# Patient Record
Sex: Female | Born: 1970 | Hispanic: No | Marital: Married | Smoking: Never smoker
Health system: Southern US, Community
[De-identification: ages and names within clinical notes are randomized; demographics above are authoritative.]

---

## 2021-08-19 NOTE — Congregational Nurse Program (Signed)
  Dept: [phone]   Congregational Nurse Program Note  Date of Encounter: 08/19/2021  Past Medical History: No past medical history on file.  Encounter Details:  Newly arrived refugee from [place] as a refugee c/o Headache and neck pain. She lost her glasses a while back and has been experiencing headaches. She was concerned that headache may be related to high blood pressure but her reading was normal today. I will continue to monitor her on weekly basis.I have advised OTC analgesic like Tylenol and home remedies like warm compresses on her neck area.  Reading glasses size 2.5 problem. She is waiting for her medicaid and will establish PCP services thereafter.  Arman Bogus RN BSn PCCN  [facility] [phone]-cell [phone]-office

## 2021-08-20 ENCOUNTER — Other Ambulatory Visit: Payer: Self-pay

## 2021-09-18 NOTE — Congregational Nurse Program (Signed)
  Dept: [phone]   Congregational Nurse Program Note  Date of Encounter: 09/18/2021  Past Medical History: No past medical history on file.  Encounter Details:  CNP Questionnaire - 09/17/21 1400       Questionnaire   Do you give verbal consent to treat you today? Yes    Location Patient Served  [facility]    Visit Setting Church or Organization    Patient Status Refugee    Insurance Medicaid    Insurance Referral [facility]    Medication Have Medication Insecurities    Medical Provider Yes    Medical Referral Other    Medical Appointment Made Other    Food N/A    Transportation Need transportation assistance    Housing/Utilities N/A    ED Visit Averted Yes    Life-Saving Intervention Made N/A           Patient is requesting transportation assistance for an appointment with [facility] health Department. Patient will be assisted once she provided a phone number.   Nicole Cella Chester Sibert RN BSn PCCN  [facility] [phone]-cell [phone]-office

## 2021-10-08 DIAGNOSIS — R7309 Other abnormal glucose: Secondary | ICD-10-CM

## 2021-10-08 LAB — GLUCOSE, POCT (MANUAL RESULT ENTRY): POC Glucose: 98 mg/dl (ref 70–99)

## 2021-10-08 NOTE — Congregational Nurse Program (Signed)
  Dept: [phone]   Congregational Nurse Program Note  Date of Encounter: 10/08/2021  Past Medical History: No past medical history on file.  Encounter Details: Patient is requesting reading glasses, blood pressure check and blood sugar check. Reading glasses +2.00 provided per patient preference.   Nicole Cella Arantza Darrington RN BSn PCCN  [facility] [phone]-cell [phone]-office

## 2021-10-28 ENCOUNTER — Other Ambulatory Visit: Payer: Self-pay | Admitting: Obstetrics

## 2021-11-24 NOTE — Progress Notes (Signed)
Subjective:    Shannon Riley - 50 y.o. female MRN [id number]  Date of birth: 23-Oct-1971  HPI  Shannon Riley is to establish care.   Current issues and/or concerns: Reports spicy and fried foods cause heartburn and stomachache. Taking Omeprazole without relief. Was taking another medication that helped but cannot recall name. Denies coughing up blood or blood in the stool. Also, would like eye and dental exam.   ROS per HPI    Health Maintenance:  Health Maintenance Due  Topic Date Due   HIV Screening  Never done   Hepatitis C Screening  Never done   PAP SMEAR-Modifier  Never done   COLONOSCOPY (Pts 45-87yrs Insurance coverage will need to be confirmed)  Never done   MAMMOGRAM  Never done   Zoster Vaccines- Shingrix (1 of 2) Never done     Past Medical History: There are no problems to display for this patient.   Social History   reports that she has never smoked. She has never used smokeless tobacco. She reports that she does not drink alcohol and does not use drugs.   Family History  family history is not on file.   Medications: reviewed and updated   Objective:   Physical Exam BP 118/79 (BP Location: Left Arm, Patient Position: Sitting, Cuff Size: Normal)    Pulse 72    Temp 98 F (36.7 C)    Resp 18    Ht 5' 3.39" (1.61 m)    Wt 158 lb 3.2 oz (71.8 kg)    SpO2 98%    BMI 27.68 kg/m   Physical Exam HENT:     Head: Normocephalic and atraumatic.  Eyes:     Extraocular Movements: Extraocular movements intact.     Conjunctiva/sclera: Conjunctivae normal.     Pupils: Pupils are equal, round, and reactive to light.  Cardiovascular:     Rate and Rhythm: Normal rate and regular rhythm.     Pulses: Normal pulses.     Heart sounds: Normal heart sounds.  Pulmonary:     Effort: Pulmonary effort is normal.     Breath sounds: Normal breath sounds.  Musculoskeletal:     Cervical back: Normal range of motion and neck supple.  Neurological:      General: No focal deficit present.     Mental Status: She is alert and oriented to person, place, and time.  Psychiatric:        Mood and Affect: Mood normal.        Behavior: Behavior normal.       Assessment & Plan:  1. Encounter to establish care: - Patient presents today to establish care.  - Return for annual physical examination, labs, and health maintenance. Arrive fasting meaning having no food for at least 8 hours prior to appointment. You may have only water or black coffee. Please take scheduled medications as normal.  2. Gastroesophageal reflux disease, unspecified whether esophagitis present: - Begin Pantoprazole as prescribed.  - Referral to Gastroenterology for further evaluation and management.  - Ambulatory referral to Gastroenterology - pantoprazole (PROTONIX) 40 MG tablet; Take 1 tablet (40 mg total) by mouth daily.  Dispense: 30 tablet; Refill: 3  3. Routine eye exam: - Referral to Ophthalmology for further evaluation and management.  - Ambulatory referral to Ophthalmology  4. Encounter for routine dental examination: - Referral to Dentistry for further evaluation and management.  - Ambulatory referral to Dentistry  5. Language barrier: - [employer] participated during  today's appointment. Interpreter Name: Nelson Chimes, ID#: [id number].    Patient was given clear instructions to go to Emergency Department or return to medical center if symptoms don't improve, worsen, or new problems develop.The patient verbalized understanding.  I discussed the assessment and treatment plan with the patient. The patient was provided an opportunity to ask questions and all were answered. The patient agreed with the plan and demonstrated an understanding of the instructions.   The patient was advised to call back or seek an in-person evaluation if the symptoms worsen or if the condition fails to improve as anticipated.    Ricky Stabs, NP 12/02/2021, 2:31 PM Primary Care at  [facility]

## 2021-12-02 ENCOUNTER — Ambulatory Visit (INDEPENDENT_AMBULATORY_CARE_PROVIDER_SITE_OTHER): Payer: Medicaid Other | Admitting: Family

## 2021-12-02 ENCOUNTER — Encounter (INDEPENDENT_AMBULATORY_CARE_PROVIDER_SITE_OTHER): Payer: Self-pay

## 2021-12-02 ENCOUNTER — Encounter: Payer: Self-pay | Admitting: Family

## 2021-12-02 ENCOUNTER — Other Ambulatory Visit: Payer: Self-pay

## 2021-12-02 VITALS — BP 118/79 | HR 72 | Temp 98.0°F | Resp 18 | Ht 63.39 in | Wt 158.2 lb

## 2021-12-02 DIAGNOSIS — Z789 Other specified health status: Secondary | ICD-10-CM

## 2021-12-02 DIAGNOSIS — Z0100 Encounter for examination of eyes and vision without abnormal findings: Secondary | ICD-10-CM | POA: Diagnosis not present

## 2021-12-02 DIAGNOSIS — Z7689 Persons encountering health services in other specified circumstances: Secondary | ICD-10-CM | POA: Diagnosis not present

## 2021-12-02 DIAGNOSIS — Z0120 Encounter for dental examination and cleaning without abnormal findings: Secondary | ICD-10-CM | POA: Diagnosis not present

## 2021-12-02 DIAGNOSIS — K219 Gastro-esophageal reflux disease without esophagitis: Secondary | ICD-10-CM

## 2021-12-02 MED ORDER — PANTOPRAZOLE SODIUM 40 MG PO TBEC: 40.0000 mg | DELAYED_RELEASE_TABLET | Freq: Every day | ORAL | 3 refills | Status: DC

## 2021-12-02 NOTE — Progress Notes (Signed)
Pt presents to establish care been having gastric issues, Request referral to opthalmology and dental

## 2021-12-02 NOTE — Patient Instructions (Signed)
Thank you for choosing Primary Care at [facility] for your medical home!    Shannon Riley was seen by Rema Fendt, NP today.   Shannon Riley's primary care provider is Ricky Stabs, NP.   For the best care possible,  you should try to see Ricky Stabs, NP whenever you come to clinic.   We look forward to seeing you again soon!  If you have any questions about your visit today,  please call us at [phone]  Or feel free to reach your provider via MyChart.    Keeping you healthy   Get these tests Blood pressure- Have your blood pressure checked once a year by your healthcare provider.  Normal blood pressure is 120/80. Weight- Have your body mass index (BMI) calculated to screen for obesity.  BMI is a measure of body fat based on height and weight. You can also calculate your own BMI at https://www.west-esparza.com/. Cholesterol- Have your cholesterol checked regularly starting at age 43, sooner may be necessary if you have diabetes, high blood pressure, if a family member developed heart diseases at an early age or if you smoke.  Chlamydia, HIV, and other sexual transmitted disease- Get screened each year until the age of 55 then within three months of each new sexual partner. Diabetes- Have your blood sugar checked regularly if you have high blood pressure, high cholesterol, a family history of diabetes or if you are overweight.   Get these vaccines Flu shot- Every fall. Tetanus shot- Every 10 years. Menactra- Single dose; prevents meningitis.   Take these steps Don't smoke- If you do smoke, ask your healthcare provider about quitting. For tips on how to quit, go to www.smokefree.gov or call 1-800-QUIT-NOW. Be physically active- Exercise 5 days a week for at least 30 minutes.  If you are not already physically active start slow and gradually work up to 30 minutes of moderate physical activity.  Examples of moderate activity include walking briskly, mowing the  yard, dancing, swimming bicycling, etc. Eat a healthy diet- Eat a variety of healthy foods such as fruits, vegetables, low fat milk, low fat cheese, yogurt, lean meats, poultry, fish, beans, tofu, etc.  For more information on healthy eating, go to www.thenutritionsource.org Drink alcohol in moderation- Limit alcohol intake two drinks or less a day.  Never drink and drive. Dentist- Brush and floss teeth twice daily; visit your dentis twice a year. Depression-Your emotional health is as important as your physical health.  If you're feeling down, losing interest in things you normally enjoy please talk with your healthcare provider. Gun Safety- If you keep a gun in your home, keep it unloaded and with the safety lock on.  Bullets should be stored separately. Helmet use- Always wear a helmet when riding a motorcycle, bicycle, rollerblading or skateboarding. Safe sex- If you may be exposed to a sexually transmitted infection, use a condom Seat belts- Seat bels can save your life; always wear one. Smoke/Carbon Monoxide detectors- These detectors need to be installed on the appropriate level of your home.  Replace batteries at least once a year. Skin Cancer- When out in the sun, cover up and use sunscreen SPF 15 or higher. Violence- If anyone is threatening or hurting you, please tell your healthcare provider.

## 2021-12-10 ENCOUNTER — Other Ambulatory Visit: Payer: Self-pay | Admitting: Obstetrics and Gynecology

## 2021-12-10 ENCOUNTER — Ambulatory Visit
Admission: RE | Admit: 2021-12-10 | Discharge: 2021-12-10 | Disposition: A | Discharge status: Home / Self Care | Payer: No Typology Code available for payment source | Source: Ambulatory Visit | Attending: Obstetrics and Gynecology | Admitting: Obstetrics and Gynecology

## 2021-12-10 DIAGNOSIS — R7611 Nonspecific reaction to tuberculin skin test without active tuberculosis: Secondary | ICD-10-CM

## 2022-01-07 ENCOUNTER — Encounter: Payer: PRIVATE HEALTH INSURANCE | Admitting: Family

## 2022-01-31 NOTE — Progress Notes (Signed)
? ? ?Patient ID: Shannon Riley, female    DOB: 09-17-1971  MRN: [id number] ? ?CC: Annual Physical Exam  ? ?Subjective: ?Shannon Riley is a 51 y.o. female who presents for annual physical exam.  ? ?Her concerns today include:  ?VAGINAL CONCERN: ?Feels pressure in vaginal area. Denies pain with urination, blood in urine, vaginal itching and additional red flag symptoms. Declines PAP smear on today. ? ?2. RIGHT SHOULDER PAIN: ?3. RIGHT HIP PAIN: ?Having right shoulder and right hip pain for 10 to 12 days. Denies recent injury/trauma, chest pain, shortness of breath, edema, heavy lifting/pushing/pulling and additional red flag symptoms. Taking over-the-counter Tylenol providing some relief.  ? ? ?Current Outpatient Medications on File Prior to Visit  ?Medication Sig Dispense Refill  ? pantoprazole (PROTONIX) 40 MG tablet Take 1 tablet (40 mg total) by mouth daily. 30 tablet 3  ? ?No current facility-administered medications on file prior to visit.  ? ? ?No Known Allergies ? ?Social History  ? ?Socioeconomic History  ? Marital status: Married  ?  Spouse name: Not on file  ? Number of children: Not on file  ? Years of education: Not on file  ? Highest education level: Not on file  ?Occupational History  ? Not on file  ?Tobacco Use  ? Smoking status: Never  ?  Passive exposure: Never  ? Smokeless tobacco: Never  ?Vaping Use  ? Vaping Use: Never used  ?Substance and Sexual Activity  ? Alcohol use: Never  ? Drug use: Never  ? Sexual activity: Yes  ?Other Topics Concern  ? Not on file  ?Social History Narrative  ? Not on file  ? ?Social Determinants of Health  ? ?Financial Resource Strain: Not on file  ?Food Insecurity: Not on file  ?Transportation Needs: Not on file  ?Physical Activity: Not on file  ?Stress: Not on file  ?Social Connections: Not on file  ?Intimate Partner Violence: Not on file  ? ? ?History reviewed. No pertinent family history. ? ?History reviewed. No pertinent surgical history. ? ?ROS: ?Review of  Systems ?Negative except as stated above ? ?PHYSICAL EXAM: ?BP 96/66 (BP Location: Left Arm, Patient Position: Sitting, Cuff Size: Normal)   Pulse 70   Temp 98.3 ?F (36.8 ?C)   Resp 18   Ht 5' 3.39" (1.61 m)   Wt 165 lb (74.8 kg)   SpO2 98%   BMI 28.87 kg/m?  ? ?Physical Exam ?HENT:  ?   Head: Normocephalic and atraumatic.  ?   Right Ear: Tympanic membrane, ear canal and external ear normal.  ?   Left Ear: Tympanic membrane, ear canal and external ear normal.  ?Eyes:  ?   Extraocular Movements: Extraocular movements intact.  ?   Conjunctiva/sclera: Conjunctivae normal.  ?   Pupils: Pupils are equal, round, and reactive to light.  ?Cardiovascular:  ?   Rate and Rhythm: Normal rate and regular rhythm.  ?   Pulses: Normal pulses.  ?   Heart sounds: Normal heart sounds.  ?Pulmonary:  ?   Effort: Pulmonary effort is normal.  ?   Breath sounds: Normal breath sounds.  ?Chest:  ?   Comments: Patient declined exam. ?Abdominal:  ?   General: Bowel sounds are normal.  ?   Palpations: Abdomen is soft.  ?Genitourinary: ?   Comments: Patient declined exam.  ?Musculoskeletal:     ?   General: Normal range of motion.  ?   Cervical back: Normal range of motion and neck supple.  ?  Skin: ?   General: Skin is warm and dry.  ?   Capillary Refill: Capillary refill takes less than 2 seconds.  ?Neurological:  ?   General: No focal deficit present.  ?   Mental Status: She is alert and oriented to person, place, and time.  ?Psychiatric:     ?   Mood and Affect: Mood normal.     ?   Behavior: Behavior normal.  ? ?ASSESSMENT AND PLAN: ?1. Annual physical exam: ?- Counseled on 150 minutes of exercise per week as tolerated, healthy eating (including decreased daily intake of saturated fats, cholesterol, added sugars, sodium), STI prevention, and routine healthcare maintenance. ? ?2. Screening for metabolic disorder: ?- OIZ12+WPYK to check kidney function, liver function, and electrolyte balance.  ?- CMP14+EGFR ? ?3. Screening for  deficiency anemia: ?- CBC to screen for anemia. ?- CBC ? ?4. Diabetes mellitus screening: ?- Hemoglobin A1c to screen for pre-diabetes/diabetes. ?- Hemoglobin A1c ? ?5. Screening cholesterol level: ?- Lipid panel to screen for high cholesterol.  ?- Lipid panel ? ?6. Thyroid disorder screen: ?- TSH to check thyroid function.  ?- TSH ? ?7. Encounter for screening mammogram for malignant neoplasm of breast: ?- Referral for breast cancer screening by mammogram.  ?- MM Digital Screening; Future ? ?8. Pap smear for cervical cancer screening: ?9. Routine screening for STI (sexually transmitted infection): ?10. Vaginal discomfort: ?- Referral to Gynecology for further evaluation and management.  ?- Ambulatory referral to Gynecology ? ?11. Need for hepatitis C screening test: ?- Hepatitis C antibody to screen for hepatitis C.  ?- Hepatitis C Antibody ? ?12. Encounter for screening for HIV: ?- HIV antibody to screen for human immunodeficiency virus.  ?- HIV antibody (with reflex) ? ?13. Colon cancer screening: ?- Referral to Gastroenterology for colon cancer screening by colonoscopy. ?- Ambulatory referral to Gastroenterology ? ?14. Acute pain of right shoulder: ?15. Acute right hip pain: ?- Ibuprofen as prescribed.  ?- Continue over-the-counter Acetaminophen. ?- Follow-up with primary provider in 4 weeks or sooner if needed.  ?- ibuprofen (ADVIL) 600 MG tablet; Take 1 tablet (600 mg total) by mouth every 8 (eight) hours as needed.  Dispense: 30 tablet; Refill: 0 ? ?16. Language barrier: ?- [place] in-person interpreter,  Si Gaul, participated during today's appointment.  ? ? ? ?Patient was given the opportunity to ask questions.  Patient verbalized understanding of the plan and was able to repeat key elements of the plan. Patient was given clear instructions to go to Emergency Department or return to medical center if symptoms don't improve, worsen, or new problems develop.The patient verbalized understanding. ? ? ?Orders  Placed This Encounter  ?Procedures  ? MM Digital Screening  ? HIV antibody (with reflex)  ? Hepatitis C Antibody  ? CBC  ? Lipid panel  ? TSH  ? CMP14+EGFR  ? Hemoglobin A1c  ? Ambulatory referral to Gastroenterology  ? Ambulatory referral to Gynecology  ? ? ? ?Requested Prescriptions  ? ?Signed Prescriptions Disp Refills  ? ibuprofen (ADVIL) 600 MG tablet 30 tablet 0  ?  Sig: Take 1 tablet (600 mg total) by mouth every 8 (eight) hours as needed.  ? ? ?Return in about 1 year (around 02/05/2023) for Physical per patient preference, Follow-Up or next available 4 weeks shoulder pain . ? ?Camillia Herter, NP  ?

## 2022-02-04 ENCOUNTER — Ambulatory Visit (INDEPENDENT_AMBULATORY_CARE_PROVIDER_SITE_OTHER): Payer: Medicaid Other | Admitting: Family

## 2022-02-04 ENCOUNTER — Other Ambulatory Visit: Payer: Self-pay

## 2022-02-04 ENCOUNTER — Encounter: Payer: Self-pay | Admitting: Family

## 2022-02-04 VITALS — BP 96/66 | HR 70 | Temp 98.3°F | Resp 18 | Ht 63.39 in | Wt 165.0 lb

## 2022-02-04 DIAGNOSIS — Z13220 Encounter for screening for lipoid disorders: Secondary | ICD-10-CM

## 2022-02-04 DIAGNOSIS — Z1329 Encounter for screening for other suspected endocrine disorder: Secondary | ICD-10-CM

## 2022-02-04 DIAGNOSIS — M25551 Pain in right hip: Secondary | ICD-10-CM

## 2022-02-04 DIAGNOSIS — Z124 Encounter for screening for malignant neoplasm of cervix: Secondary | ICD-10-CM

## 2022-02-04 DIAGNOSIS — Z789 Other specified health status: Secondary | ICD-10-CM

## 2022-02-04 DIAGNOSIS — Z1231 Encounter for screening mammogram for malignant neoplasm of breast: Secondary | ICD-10-CM

## 2022-02-04 DIAGNOSIS — Z0001 Encounter for general adult medical examination with abnormal findings: Secondary | ICD-10-CM

## 2022-02-04 DIAGNOSIS — Z0000 Encounter for general adult medical examination without abnormal findings: Secondary | ICD-10-CM

## 2022-02-04 DIAGNOSIS — Z131 Encounter for screening for diabetes mellitus: Secondary | ICD-10-CM

## 2022-02-04 DIAGNOSIS — Z13228 Encounter for screening for other metabolic disorders: Secondary | ICD-10-CM

## 2022-02-04 DIAGNOSIS — Z130 Encounter for screening for diseases of the blood and blood-forming organs and certain disorders involving the immune mechanism: Secondary | ICD-10-CM

## 2022-02-04 DIAGNOSIS — M25511 Pain in right shoulder: Secondary | ICD-10-CM | POA: Diagnosis not present

## 2022-02-04 DIAGNOSIS — N949 Unspecified condition associated with female genital organs and menstrual cycle: Secondary | ICD-10-CM | POA: Diagnosis not present

## 2022-02-04 DIAGNOSIS — Z114 Encounter for screening for human immunodeficiency virus [HIV]: Secondary | ICD-10-CM

## 2022-02-04 DIAGNOSIS — Z1211 Encounter for screening for malignant neoplasm of colon: Secondary | ICD-10-CM

## 2022-02-04 DIAGNOSIS — Z113 Encounter for screening for infections with a predominantly sexual mode of transmission: Secondary | ICD-10-CM

## 2022-02-04 DIAGNOSIS — Z1159 Encounter for screening for other viral diseases: Secondary | ICD-10-CM

## 2022-02-04 DIAGNOSIS — Z758 Other problems related to medical facilities and other health care: Secondary | ICD-10-CM

## 2022-02-04 MED ORDER — IBUPROFEN 600 MG PO TABS: 600.0000 mg | ORAL_TABLET | Freq: Three times a day (TID) | ORAL | 0 refills | Status: DC | PRN

## 2022-02-04 NOTE — Progress Notes (Signed)
Pt presents for annual physical exam declined pap, having complaints of right arm and leg pain  ?

## 2022-02-04 NOTE — Patient Instructions (Signed)

## 2022-02-05 ENCOUNTER — Encounter: Payer: Self-pay | Admitting: Family

## 2022-02-05 DIAGNOSIS — R7303 Prediabetes: Secondary | ICD-10-CM | POA: Insufficient documentation

## 2022-02-05 LAB — CMP14+EGFR
ALT: 19 IU/L (ref 0–32)
AST: 20 IU/L (ref 0–40)
Albumin/Globulin Ratio: 1.6 (ref 1.2–2.2)
Albumin: 4.5 g/dL (ref 3.8–4.9)
Alkaline Phosphatase: 82 IU/L (ref 44–121)
BUN/Creatinine Ratio: 17 (ref 9–23)
BUN: 11 mg/dL (ref 6–24)
Bilirubin Total: <0.2 mg/dL (ref 0.0–1.2)
CO2: 24 mmol/L (ref 20–29)
Calcium: 9.1 mg/dL (ref 8.7–10.2)
Chloride: 104 mmol/L (ref 96–106)
Creatinine, Ser: 0.66 mg/dL (ref 0.57–1.00)
Globulin, Total: 2.8 g/dL (ref 1.5–4.5)
Glucose: 95 mg/dL (ref 70–99)
Potassium: 4.4 mmol/L (ref 3.5–5.2)
Sodium: 141 mmol/L (ref 134–144)
Total Protein: 7.3 g/dL (ref 6.0–8.5)
eGFR: 106 mL/min/{1.73_m2} (ref >59)

## 2022-02-05 LAB — HEMOGLOBIN A1C
Est. average glucose Bld gHb Est-mCnc: 120 mg/dL
Hgb A1c MFr Bld: 5.8 % — ABNORMAL HIGH (ref 4.8–5.6)

## 2022-02-05 LAB — CBC
Hematocrit: 39.4 % (ref 34.0–46.6)
Hemoglobin: 12.8 g/dL (ref 11.1–15.9)
MCH: 26.9 pg (ref 26.6–33.0)
MCHC: 32.5 g/dL (ref 31.5–35.7)
MCV: 83 fL (ref 79–97)
Platelets: 337 10*3/uL (ref 150–450)
RBC: 4.76 x10E6/uL (ref 3.77–5.28)
RDW: 13.1 % (ref 11.7–15.4)
WBC: 5.8 10*3/uL (ref 3.4–10.8)

## 2022-02-05 LAB — HEPATITIS C ANTIBODY: Hep C Virus Ab: NONREACTIVE

## 2022-02-05 LAB — HIV ANTIBODY (ROUTINE TESTING W REFLEX): HIV Screen 4th Generation wRfx: NONREACTIVE

## 2022-02-05 LAB — LIPID PANEL
Chol/HDL Ratio: 3.7 ratio (ref 0.0–4.4)
Cholesterol, Total: 181 mg/dL (ref 100–199)
HDL: 49 mg/dL (ref >39)
LDL Chol Calc (NIH): 105 mg/dL — ABNORMAL HIGH (ref 0–99)
Triglycerides: 157 mg/dL — ABNORMAL HIGH (ref 0–149)
VLDL Cholesterol Cal: 27 mg/dL (ref 5–40)

## 2022-02-05 LAB — TSH: TSH: 1.79 u[IU]/mL (ref 0.450–4.500)

## 2022-02-05 NOTE — Progress Notes (Signed)
Call patient with update.  ? ?The following abnormalities are noted:   ?-  Hemoglobin A1c is consistent with pre-diabetes. Practice healthy eating habits of fresh fruit and vegetables, lean baked meats such as chicken, fish, and [place]; limit breads, rice, pastas, and desserts; practice regular aerobic exercise (at least 150 minutes a week as tolerated). No medication needed as of present. ?-  Cholesterol higher than expected. High cholesterol may increase risk of heart attack and/or stroke. Consider eating more fruits, vegetables, and lean baked meats such as chicken or fish. Moderate intensity exercise at least 150 minutes as tolerated per week may help as well. However, your risk of heart attack/stroke in ten years is low so does not need to start a medication as of present. ? ?All other values are normal, stable or within acceptable limits. ? ?Medication changes / Follow up labs / Other changes or recommendations:   ?- Recheck prediabetes in 6  months.  ?- Recheck cholesterol routinely.  ? ?The following is for provider reference only: ?The 10-year ASCVD risk score (Arnett DK, et al., 2019) is: 0.7% ?  Values used to calculate the score: ?    Age: 50 years ?    Sex: Female ?    Is Non-Hispanic African American: Yes ?    Diabetic: No ?    Tobacco smoker: No ?    Systolic Blood Pressure: 96 mmHg ?    Is BP treated: No ?    HDL Cholesterol: 49 mg/dL ?    Total Cholesterol: 181 mg/dL ? ?Rema Fendt, NP 02/05/2022 7:31 AM  ?

## 2022-02-18 ENCOUNTER — Other Ambulatory Visit: Payer: Self-pay

## 2022-02-18 ENCOUNTER — Ambulatory Visit (HOSPITAL_COMMUNITY)
Admission: EM | Admit: 2022-02-18 | Discharge: 2022-02-18 | Disposition: A | Discharge status: Home / Self Care | Payer: Medicaid Other | Attending: Family Medicine | Admitting: Family Medicine

## 2022-02-18 ENCOUNTER — Encounter (HOSPITAL_COMMUNITY): Payer: Self-pay | Admitting: Emergency Medicine

## 2022-02-18 DIAGNOSIS — M25511 Pain in right shoulder: Secondary | ICD-10-CM

## 2022-02-18 LAB — GLUCOSE, POCT (MANUAL RESULT ENTRY): POC Glucose: 124 mg/dl — AB (ref 70–99)

## 2022-02-18 MED ORDER — MELOXICAM 15 MG PO TABS: 15.0000 mg | ORAL_TABLET | Freq: Every day | ORAL | 0 refills | Status: DC

## 2022-02-18 NOTE — Congregational Nurse Program (Signed)
?  Dept: [phone] ? ? ?Congregational Nurse Program Note ? ?Date of Encounter: 02/18/2022 ? ?Past Medical History: ?No past medical history on file. ? ?Encounter Details: ? CNP Questionnaire - 02/18/22 1116   ? ?  ? Questionnaire  ? Do you give verbal consent to treat you today? Yes   ? Location Patient Served  [facility]   ? Visit Setting Church or Organization   ? Patient Status Refugee   ? Insurance Medicaid   ? Insurance Referral [facility]   ? Medication Have Medication Insecurities;Provided Medication Assistance   ? Medical Provider Yes   ? Screening Referrals N/A   ? Medical Referral Urgent Care   ? Medical Appointment Made N/A   ? Food N/A   ? Transportation Need transportation assistance   ? Housing/Utilities N/A   ? Interpersonal Safety N/A   ? Intervention Blood pressure;Navigate Healthcare System;Counsel;Educate;Support;Advocate;Blood glucose;Case Management   ? ED Visit Averted Yes   ? Life-Saving Intervention Made N/A   ? ?  ?  ? ?  ? ?Patient speaks [employer], on site interpreter used for this visit.  ?C/o right shoulder and upper arm pain.  Blood sugar 124 non fasting. Patient denies falling or injury to shoulder. ?Referred to urgent care. ? ?Arman Bogus RN BSn PCCN  ?[facility] ?[phone]-cell ?[phone]-office ? ? ? ? ?

## 2022-02-18 NOTE — ED Provider Notes (Signed)
? ?[facility] ? ? ? ?CSN: [id number] ?Arrival date & time: 02/18/22  1207 ? ? ?  ? ?History   ?Chief Complaint ?Chief Complaint  ?Patient presents with  ? Shoulder Pain  ? ? ?HPI ?Shannon Riley is a 51 y.o. female.  ? ?Right Shoulder Pain ?Started 1 month ago ?Insidious onset, no injury ?States that it is mostly in the lateral aspect of her shoulder, but has been radiating up into her neck and down into her lower arm ?Denies any numbness and tingling ?States that she was taking her ESL class today and was sent to the nurse because she was having difficulty holding her pencil due to the pain ?Was sent here for further evaluation ?She has been taking Tylenol for this and not improving ?She does not have a history of shoulder pain or neck problems ?No fevers or chills ?Otherwise feeling well ? ?Dari iPad interpretor Porfirio MylarCarmen used for entirety of encounter ? ? ? ?History reviewed. No pertinent past medical history. ? ?Patient Active Problem List  ? Diagnosis Date Noted  ? Prediabetes 02/05/2022  ? ? ?History reviewed. No pertinent surgical history. ? ?OB History   ?No obstetric history on file. ?  ? ? ? ?Home Medications   ? ?Prior to Admission medications   ?Medication Sig Start Date End Date Taking? Authorizing Provider  ?meloxicam (MOBIC) 15 MG tablet Take 1 tablet (15 mg total) by mouth daily. 02/18/22  Yes Sheray Grist, Solmon IceBailey J, DO  ?ibuprofen (ADVIL) 600 MG tablet Take 1 tablet (600 mg total) by mouth every 8 (eight) hours as needed. 02/04/22   Rema FendtStephens, Amy J, NP  ?pantoprazole (PROTONIX) 40 MG tablet Take 1 tablet (40 mg total) by mouth daily. 12/02/21   Rema FendtStephens, Amy J, NP  ? ? ?Family History ?No family history on file. ? ?Social History ?Social History  ? ?Tobacco Use  ? Smoking status: Never  ?  Passive exposure: Never  ? Smokeless tobacco: Never  ?Vaping Use  ? Vaping Use: Never used  ?Substance Use Topics  ? Alcohol use: Never  ? Drug use: Never  ? ? ? ?Allergies   ?Patient has no known  allergies. ? ? ?Review of Systems ?Review of Systems  ?All other systems reviewed and are negative. ? ?Per HPI ?Physical Exam ?Triage Vital Signs ?ED Triage Vitals  ?Enc Vitals Group  ?   BP   ?   Pulse   ?   Resp   ?   Temp   ?   Temp src   ?   SpO2   ?   Weight   ?   Height   ?   Head Circumference   ?   Peak Flow   ?   Pain Score   ?   Pain Loc   ?   Pain Edu?   ?   Excl. in GC?   ? ?No data found. ? ?Updated Vital Signs ?BP 118/82 (BP Location: Left Arm)   Pulse 77   Temp 98 ?F (36.7 ?C) (Oral)   Resp 17   SpO2 98%  ? ?Visual Acuity ?Right Eye Distance:   ?Left Eye Distance:   ?Bilateral Distance:   ? ?Right Eye Near:   ?Left Eye Near:    ?Bilateral Near:    ? ?Physical Exam ?Constitutional:   ?   General: She is not in acute distress. ?   Appearance: She is well-developed. She is not ill-appearing or toxic-appearing.  ?HENT:  ?  Head: Normocephalic and atraumatic.  ?   Right Ear: Tympanic membrane, ear canal and external ear normal.  ?   Left Ear: Tympanic membrane, ear canal and external ear normal.  ?   Nose: Nose normal. No congestion or rhinorrhea.  ?   Mouth/Throat:  ?   Mouth: Mucous membranes are moist.  ?   Pharynx: No oropharyngeal exudate or posterior oropharyngeal erythema.  ?Eyes:  ?   Conjunctiva/sclera: Conjunctivae normal.  ?Cardiovascular:  ?   Rate and Rhythm: Normal rate.  ?Pulmonary:  ?   Effort: Pulmonary effort is normal. No respiratory distress.  ?   Breath sounds: Normal breath sounds. No wheezing, rhonchi or rales.  ?Musculoskeletal:  ?   Cervical back: Neck supple. No rigidity or tenderness.  ?   Comments: Right Shoulder: ?Inspection reveals no obvious deformity, atrophy, or asymmetry b/l. No bruising. No swelling ?Palpation is normal with no TTP over [facility] joint or bicipital groove b/l.  She does have some tenderness and hypertonicity within the right upper trapezius ?Full ROM in flexion, abduction b/l with pain at extremes of motion ?On right, IR to back pocket, ER decreased by 20  degrees ?On left full IR and ER ?NV intact distally b/l ?Special Tests:  ?- Impingement: Positive Hawkins ?- Supraspinatous: Equivocal empty can ?- Infraspinatous/Teres Minor: 5/5 strength with ER with pain ?- Subscapularis: 5/5 strength with IR with pain ?- Biceps tendon: Positive Speeds ?- Labrum: Positive Obriens ?- No drop arm sign ?   ?Lymphadenopathy:  ?   Cervical: No cervical adenopathy.  ?Skin: ?   General: Skin is warm and dry.  ?   Capillary Refill: Capillary refill takes less than 2 seconds.  ?Neurological:  ?   Mental Status: She is alert and oriented to person, place, and time.  ? ? ? ?UC Treatments / Results  ?Labs ?(all labs ordered are listed, but only abnormal results are displayed) ?Labs Reviewed - No data to display ? ?EKG ? ? ?Radiology ?No results found. ? ?Procedures ?Procedures (including critical care time) ? ?Medications Ordered in UC ?Medications - No data to display ? ?Initial Impression / Assessment and Plan / UC Course  ?I have reviewed the triage vital signs and the nursing notes. ? ?Pertinent labs & imaging results that were available during my care of the patient were reviewed by me and considered in my medical decision making (see chart for details). ? ?  ? ?Exam and history most consistent with right shoulder rotator cuff tendinitis.  She has no weakness on exam which is reassuring, although pan-positive pain.  Will trial meloxicam daily for 1-2 weeks, then daily as needed for her pain.  Advised to avoid other NSAIDs while taking, but can take tylenol prn.  Recommend pendulum swings as well.  Can f/u with sports medicine in 1-2 weeks if not improving. ? ? ?Final Clinical Impressions(s) / UC Diagnoses  ? ?Final diagnoses:  ?Acute pain of right shoulder  ? ? ? ?Discharge Instructions   ? ?  ?As we discussed, I think that you are aggravated your rotator cuff.  You can take the medication that I prescribed for you once daily for the next 1 to 2 weeks.  This medicine will help with pain  and inflammation.  Do not take it with ibuprofen.  You can take Tylenol while you are taking this.  Make sure that you continue to move your shoulder, but do not do any heavy overhead lifting.  You can follow-up with the  sports medicine office in 1 to 2 weeks if you are not improving. ? ? ? ? ?ED Prescriptions   ? ? Medication Sig Dispense Auth. Provider  ? meloxicam (MOBIC) 15 MG tablet Take 1 tablet (15 mg total) by mouth daily. 15 tablet Macio Kissoon, Solmon Ice, DO  ? ?  ? ?PDMP not reviewed this encounter. ?  ?Unknown Jim, DO ?02/18/22 1334 ? ?

## 2022-02-18 NOTE — Discharge Instructions (Signed)
As we discussed, I think that you are aggravated your rotator cuff.  You can take the medication that I prescribed for you once daily for the next 1 to 2 weeks.  This medicine will help with pain and inflammation.  Do not take it with ibuprofen.  You can take Tylenol while you are taking this.  Make sure that you continue to move your shoulder, but do not do any heavy overhead lifting.  You can follow-up with the sports medicine office in 1 to 2 weeks if you are not improving. ?

## 2022-02-18 NOTE — ED Triage Notes (Signed)
Pt c/o right shoulder pain for a month and referred here by community nurse. Denies any falls or injuries. Pt reports that pain also in neck and right arm. Pt reports the pain so bad can't lift her right arm.  ?

## 2022-02-23 NOTE — Congregational Nurse Program (Signed)
Patient came to check blood pressure. Pressure 128/90 ?

## 2022-03-01 NOTE — Progress Notes (Signed)
? ? ?Patient ID: Shannon Riley, female    DOB: [birth year]  MRN: [id number] ? ?CC: Urgent Care Follow-Up ? ?Subjective: ?Shannon Riley is a 51 y.o. female who presents for urgent care follow-up.  ? ?Her concerns today include:  ?URGENT CARE FOLLOW-UP: ?02/18/2022 at [facility] per DO note: ?Exam and history most consistent with right shoulder rotator cuff tendinitis.  She has no weakness on exam which is reassuring, although pan-positive pain.  Will trial meloxicam daily for 1-2 weeks, then daily as needed for her pain.  Advised to avoid other NSAIDs while taking, but can take tylenol prn.  Recommend pendulum swings as well.  Can f/u with sports medicine in 1-2 weeks if not improving. ? ?03/05/2022: ?Continued right shoulder pain with limited range of motion. Meloxicam helping some.  ? ?2. WOMEN'S HEALTH CONCERN: ?Reports no period for 6 months. Also, feels like pressure in uterus especially right lower side. Requesting female only provider at Gynecology. ? ? ?Patient Active Problem List  ? Diagnosis Date Noted  ? Prediabetes 02/05/2022  ?  ? ?Current Outpatient Medications on File Prior to Visit  ?Medication Sig Dispense Refill  ? ibuprofen (ADVIL) 600 MG tablet Take 1 tablet (600 mg total) by mouth every 8 (eight) hours as needed. 30 tablet 0  ? pantoprazole (PROTONIX) 40 MG tablet Take 1 tablet (40 mg total) by mouth daily. 30 tablet 3  ? ?No current facility-administered medications on file prior to visit.  ? ? ?No Known Allergies ? ?Social History  ? ?Socioeconomic History  ? Marital status: Married  ?  Spouse name: Not on file  ? Number of children: Not on file  ? Years of education: Not on file  ? Highest education level: Not on file  ?Occupational History  ? Not on file  ?Tobacco Use  ? Smoking status: Never  ?  Passive exposure: Never  ? Smokeless tobacco: Never  ?Vaping Use  ? Vaping Use: Never used  ?Substance and Sexual Activity  ? Alcohol use: Never  ? Drug use: Never  ?  Sexual activity: Yes  ?Other Topics Concern  ? Not on file  ?Social History Narrative  ? Not on file  ? ?Social Determinants of Health  ? ?Financial Resource Strain: Not on file  ?Food Insecurity: Not on file  ?Transportation Needs: Not on file  ?Physical Activity: Not on file  ?Stress: Not on file  ?Social Connections: Not on file  ?Intimate Partner Violence: Not on file  ? ? ?No family history on file. ? ?No past surgical history on file. ? ?ROS: ?Review of Systems ?Negative except as stated above ? ?PHYSICAL EXAM: ?BP 120/82 (BP Location: Left Arm, Patient Position: Sitting, Cuff Size: Large)   Pulse 61   Temp 98 ?F (36.7 ?C)   Resp 18   Ht 5' 3.39" (1.61 m)   Wt 160 lb (72.6 kg)   SpO2 98%   BMI 28.00 kg/m?  ? ?Physical Exam ?HENT:  ?   Head: Normocephalic and atraumatic.  ?Eyes:  ?   Extraocular Movements: Extraocular movements intact.  ?   Conjunctiva/sclera: Conjunctivae normal.  ?   Pupils: Pupils are equal, round, and reactive to light.  ?Cardiovascular:  ?   Rate and Rhythm: Normal rate and regular rhythm.  ?   Pulses: Normal pulses.  ?   Heart sounds: Normal heart sounds.  ?Pulmonary:  ?   Effort: Pulmonary effort is normal.  ?   Breath sounds: Normal  breath sounds.  ?Musculoskeletal:  ?   Cervical back: Normal range of motion and neck supple.  ?   Comments: Limited range of motion right upper extremity.   ?Neurological:  ?   General: No focal deficit present.  ?   Mental Status: She is alert and oriented to person, place, and time.  ?Psychiatric:     ?   Mood and Affect: Mood normal.     ?   Behavior: Behavior normal.  ? ? ?ASSESSMENT AND PLAN: ?1. Acute pain of right shoulder: ?- Continue Meloxicam as prescribed.  ?- Diagnostic x-ray right shoulder for further evaluation.  ?- Referral to Sports Medicine for further evaluation and management.  ?- Ambulatory referral to Sports Medicine ?- DG Shoulder Right; Future ?- meloxicam (MOBIC) 15 MG tablet; Take 1 tablet (15 mg total) by mouth daily.   Dispense: 30 tablet; Refill: 0 ? ?2. No periods: ?3. Uterus problem: ?- Discussed possibly nearing menopause.  ?- Referral to Gynecology for further evaluation and management. ?- Ambulatory referral to Gynecology ? ?4. Language barrier: ?- [place] in-person interpreter, Mihan, participated during today's appointment.  ? ? ? ?Patient was given the opportunity to ask questions.  Patient verbalized understanding of the plan and was able to repeat key elements of the plan. Patient was given clear instructions to go to Emergency Department or return to medical center if symptoms don't improve, worsen, or new problems develop.The patient verbalized understanding. ? ? ?Orders Placed This Encounter  ?Procedures  ? DG Shoulder Right  ? Ambulatory referral to Sports Medicine  ? Ambulatory referral to Gynecology  ? ? ? ?Requested Prescriptions  ? ?Signed Prescriptions Disp Refills  ? meloxicam (MOBIC) 15 MG tablet 30 tablet 0  ?  Sig: Take 1 tablet (15 mg total) by mouth daily.  ? ? ?Follow-up with primary provider as scheduled.  ? ?Rema Fendt, NP  ?

## 2022-03-05 ENCOUNTER — Encounter (INDEPENDENT_AMBULATORY_CARE_PROVIDER_SITE_OTHER): Payer: Self-pay

## 2022-03-05 ENCOUNTER — Ambulatory Visit (INDEPENDENT_AMBULATORY_CARE_PROVIDER_SITE_OTHER): Payer: Medicaid Other

## 2022-03-05 ENCOUNTER — Ambulatory Visit: Payer: Medicaid Other | Admitting: Family

## 2022-03-05 ENCOUNTER — Encounter: Payer: Self-pay | Admitting: Family

## 2022-03-05 VITALS — BP 120/82 | HR 61 | Temp 98.0°F | Resp 18 | Ht 63.39 in | Wt 160.0 lb

## 2022-03-05 DIAGNOSIS — N912 Amenorrhea, unspecified: Secondary | ICD-10-CM

## 2022-03-05 DIAGNOSIS — Z789 Other specified health status: Secondary | ICD-10-CM | POA: Diagnosis not present

## 2022-03-05 DIAGNOSIS — N949 Unspecified condition associated with female genital organs and menstrual cycle: Secondary | ICD-10-CM

## 2022-03-05 DIAGNOSIS — M25511 Pain in right shoulder: Secondary | ICD-10-CM

## 2022-03-05 DIAGNOSIS — Z603 Acculturation difficulty: Secondary | ICD-10-CM

## 2022-03-05 MED ORDER — MELOXICAM 15 MG PO TABS: 15.0000 mg | ORAL_TABLET | Freq: Every day | ORAL | 0 refills | Status: DC

## 2022-03-05 NOTE — Progress Notes (Signed)
Pt presents for right shoulder pain started about a month ago  ?

## 2022-03-06 NOTE — Progress Notes (Signed)
Call patient with update.  ? ?Right shoulder spurring. Continue with plan discussed in office.

## 2022-04-06 ENCOUNTER — Ambulatory Visit
Admission: RE | Admit: 2022-04-06 | Discharge: 2022-04-06 | Disposition: A | Discharge status: Home / Self Care | Payer: Medicaid Other | Source: Ambulatory Visit | Attending: Family | Admitting: Family

## 2022-04-06 ENCOUNTER — Encounter: Payer: Self-pay | Admitting: Advanced Practice Midwife

## 2022-04-06 ENCOUNTER — Other Ambulatory Visit (HOSPITAL_COMMUNITY)
Admission: RE | Admit: 2022-04-06 | Discharge: 2022-04-06 | Disposition: A | Discharge status: Home / Self Care | Payer: Medicaid Other | Source: Ambulatory Visit | Attending: Advanced Practice Midwife | Admitting: Advanced Practice Midwife

## 2022-04-06 ENCOUNTER — Ambulatory Visit (INDEPENDENT_AMBULATORY_CARE_PROVIDER_SITE_OTHER): Payer: Medicaid Other | Admitting: Advanced Practice Midwife

## 2022-04-06 VITALS — BP 131/83 | HR 69 | Ht 64.0 in | Wt 157.6 lb

## 2022-04-06 DIAGNOSIS — R102 Pelvic and perineal pain unspecified side: Secondary | ICD-10-CM

## 2022-04-06 DIAGNOSIS — N926 Irregular menstruation, unspecified: Secondary | ICD-10-CM

## 2022-04-06 DIAGNOSIS — Z3202 Encounter for pregnancy test, result negative: Secondary | ICD-10-CM

## 2022-04-06 DIAGNOSIS — Z01419 Encounter for gynecological examination (general) (routine) without abnormal findings: Secondary | ICD-10-CM

## 2022-04-06 DIAGNOSIS — R1030 Lower abdominal pain, unspecified: Secondary | ICD-10-CM

## 2022-04-06 DIAGNOSIS — N914 Secondary oligomenorrhea: Secondary | ICD-10-CM

## 2022-04-06 DIAGNOSIS — N951 Menopausal and female climacteric states: Secondary | ICD-10-CM

## 2022-04-06 DIAGNOSIS — Z124 Encounter for screening for malignant neoplasm of cervix: Secondary | ICD-10-CM | POA: Diagnosis not present

## 2022-04-06 DIAGNOSIS — Z1231 Encounter for screening mammogram for malignant neoplasm of breast: Secondary | ICD-10-CM

## 2022-04-06 DIAGNOSIS — Z1211 Encounter for screening for malignant neoplasm of colon: Secondary | ICD-10-CM

## 2022-04-06 LAB — POCT URINALYSIS DIPSTICK
Bilirubin, UA: NEGATIVE
Glucose, UA: NEGATIVE
Ketones, UA: NEGATIVE
Nitrite, UA: NEGATIVE
Protein, UA: NEGATIVE
Spec Grav, UA: 1.01 (ref 1.010–1.025)
Urobilinogen, UA: 0.2 E.U./dL
pH, UA: 7 (ref 5.0–8.0)

## 2022-04-06 LAB — POCT URINE PREGNANCY: Preg Test, Ur: NEGATIVE

## 2022-04-06 NOTE — Progress Notes (Signed)
? ?Subjective:  ?  ? Shannon Riley is a 51 y.o. female here at [facility] for a routine exam.  Current complaints: cramping lower abdominal pain x 1 year that is intermittent.  Last menstrual period was 7 months ago, stopped when she was in refugee [place] leaving [place] so she initially thought it was stress related but periods have not resumed.  She denies any vasomotor symptoms of menopause.  Personal health questionnaire reviewed: yes. ? ?Do you have a primary care provider? yes ?Do you feel safe at home? Yes. Pt is refugee from [place] so months of living in camps in several countries before arriving in the [place].   ? ?[place] Office Visit from 04/06/2022 in [place]  ?PHQ-2 Total Score 0  ? ?  ? ? ?Health Maintenance Due  ?Topic Date Due  ? PAP SMEAR-Modifier  Never done  ? COLONOSCOPY (Pts 45-9yr Insurance coverage will need to be confirmed)  Never done  ? MAMMOGRAM  Never done  ?  ? ?Risk factors for chronic health problems: ?Smoking: ?Alchohol/how much: ?Pt BMI: Body mass index is 27.05 kg/m?. ?  ?Gynecologic History ?No LMP recorded. Patient is perimenopausal. ?Contraception: none ?Last Pap: unknown. Results were: normal ?Last mammogram: today, 04/06/22. Results were: unknown, report not yet available ? ?Obstetric History ?OB History  ?Gravida Para Term Preterm AB Living  ?1         1  ?SAB IAB Ectopic Multiple Live Births  ?        1  ?  ?# Outcome Date GA Lbr Len/2nd Weight Sex Delivery Anes PTL Lv  ?1 Gravida           ? ? ? ?The following portions of the patient's history were reviewed and updated as appropriate: allergies, current medications, past family history, past medical history, past social history, past surgical history, and problem list. ? ?Review of Systems ?Pertinent items noted in HPI and remainder of comprehensive ROS otherwise negative.  ?  ?Objective:  ? ?BP 131/83   Pulse 69   Ht 5' 4"  (1.626 m)   Wt 157 lb 9.6 oz (71.5 kg)   BMI  27.05 kg/m?  ?VS reviewed, nursing note reviewed,  ?Constitutional: well developed, well nourished, no distress ?HEENT: normocephalic ?CV: normal rate ?Pulm/chest wall: normal effort ?Breast Exam:  exam performed: right breast normal without mass, skin or nipple changes or axillary nodes, left breast normal without mass, skin or nipple changes or axillary nodes ?Abdomen: soft ?Neuro: alert and oriented x 3 ?Skin: warm, dry ?Psych: affect normal ?Pelvic exam: Performed: Cervix pink, visually closed, without lesion, scant white creamy discharge, vaginal walls and external genitalia normal ?Bimanual exam: Cervix 0/long/high, firm, anterior, neg CMT, uterus tender, slightly enlarged, adnexa without tenderness, enlargement, or mass  ? ? ?   ?Assessment/Plan:  ? ?1. Late menses ?--Likely perimenopause, but given pt stressful year as refugee, and associated abdominal pain, will do labs to confirm status and rule out other conditions. ? ?- Prolactin ?- TSH ?- CBC ?- Comp Met (CMET) ?- Beta hCG quant (ref lab) ?- POCT urine pregnancy ? ?2. Perimenopause ? ? ?3. Screening for cervical cancer ? ?- Cytology - PAP( [place]) ? ?4. Screen for colon cancer ?--Pt saw PCP, but did not discuss colon cancer screening.  Discussed cologuard vs colonoscopy.  Pt prefers to do colonoscopy with family hx colon cancer. ?- Ambulatory referral to Gastroenterology ? ?5. Secondary oligomenorrhea ? ?- [place]  PELVIC COMPLETE WITH TRANSVAGINAL; Future ? ?6. Pelvic pain in female ?--Pain intermittent, uterus slightly enlarged, ? fibroids ?- US PELVIC COMPLETE WITH TRANSVAGINAL; Future ?- POCT Urinalysis Dipstick ?- Urine Culture ? ?7. Lower abdominal pain ?--Large hemoglobin in urine, small leukocytes ?--[place], follow up in office for results ? ?- Urine Culture  ? ? ? ?Return for Schedule pelvic US and follow up gyn visit for ultrasound results.  ? ?Fatima Blank, CNM ?4:58 PM   ?

## 2022-04-06 NOTE — Progress Notes (Signed)
NGYN pt presents for annual c/o intermittent low abdominal pain x 1 yr.  ?STD screening declined  ?PHQ9=0 ?GAD7=0 ?Mammogram completed today  ?Colonoscopy - NEVER ? ?

## 2022-04-07 LAB — CBC
Hematocrit: 38.4 % (ref 34.0–46.6)
Hemoglobin: 13.1 g/dL (ref 11.1–15.9)
MCH: 27.5 pg (ref 26.6–33.0)
MCHC: 34.1 g/dL (ref 31.5–35.7)
MCV: 81 fL (ref 79–97)
Platelets: 284 10*3/uL (ref 150–450)
RBC: 4.76 x10E6/uL (ref 3.77–5.28)
RDW: 13 % (ref 11.7–15.4)
WBC: 5.6 10*3/uL (ref 3.4–10.8)

## 2022-04-07 LAB — COMPREHENSIVE METABOLIC PANEL
ALT: 14 IU/L (ref 0–32)
AST: 22 IU/L (ref 0–40)
Albumin/Globulin Ratio: 1.9 (ref 1.2–2.2)
Albumin: 4.7 g/dL (ref 3.8–4.9)
Alkaline Phosphatase: 82 IU/L (ref 44–121)
BUN/Creatinine Ratio: 14 (ref 9–23)
BUN: 9 mg/dL (ref 6–24)
Bilirubin Total: 0.4 mg/dL (ref 0.0–1.2)
CO2: 25 mmol/L (ref 20–29)
Calcium: 9.4 mg/dL (ref 8.7–10.2)
Chloride: 103 mmol/L (ref 96–106)
Creatinine, Ser: 0.66 mg/dL (ref 0.57–1.00)
Globulin, Total: 2.5 g/dL (ref 1.5–4.5)
Glucose: 97 mg/dL (ref 70–99)
Potassium: 4.1 mmol/L (ref 3.5–5.2)
Sodium: 139 mmol/L (ref 134–144)
Total Protein: 7.2 g/dL (ref 6.0–8.5)
eGFR: 106 mL/min/{1.73_m2} (ref >59)

## 2022-04-07 LAB — TSH: TSH: 1.51 u[IU]/mL (ref 0.450–4.500)

## 2022-04-07 LAB — BETA HCG QUANT (REF LAB): hCG Quant: 2 m[IU]/mL

## 2022-04-07 LAB — PROLACTIN: Prolactin: 8.5 ng/mL (ref 4.8–23.3)

## 2022-04-07 NOTE — Progress Notes (Signed)
Call patient with update.  ? ?Mammogram no evidence of malignancy. Repeat in 12 months.

## 2022-04-08 LAB — CYTOLOGY - PAP
Comment: NEGATIVE
Diagnosis: NEGATIVE
High risk HPV: NEGATIVE

## 2022-04-08 LAB — URINE CULTURE

## 2022-04-09 ENCOUNTER — Ambulatory Visit: Payer: Medicaid Other

## 2022-04-13 ENCOUNTER — Ambulatory Visit
Admission: RE | Admit: 2022-04-13 | Discharge: 2022-04-13 | Disposition: A | Discharge status: Home / Self Care | Payer: Medicaid Other | Source: Ambulatory Visit | Attending: Advanced Practice Midwife | Admitting: Advanced Practice Midwife

## 2022-04-13 DIAGNOSIS — R102 Pelvic and perineal pain: Secondary | ICD-10-CM | POA: Insufficient documentation

## 2022-04-13 DIAGNOSIS — N914 Secondary oligomenorrhea: Secondary | ICD-10-CM | POA: Diagnosis present

## 2022-04-15 ENCOUNTER — Ambulatory Visit: Payer: Medicaid Other

## 2022-04-27 ENCOUNTER — Ambulatory Visit: Payer: Self-pay

## 2022-04-27 ENCOUNTER — Ambulatory Visit (INDEPENDENT_AMBULATORY_CARE_PROVIDER_SITE_OTHER): Payer: Medicaid Other | Admitting: Family Medicine

## 2022-04-27 ENCOUNTER — Encounter: Payer: Self-pay | Admitting: Family Medicine

## 2022-04-27 ENCOUNTER — Encounter: Payer: Self-pay | Admitting: Obstetrics and Gynecology

## 2022-04-27 ENCOUNTER — Ambulatory Visit (INDEPENDENT_AMBULATORY_CARE_PROVIDER_SITE_OTHER): Payer: Medicaid Other | Admitting: Obstetrics and Gynecology

## 2022-04-27 VITALS — BP 128/82 | Ht 64.0 in | Wt 160.9 lb

## 2022-04-27 VITALS — BP 125/80 | HR 71 | Ht 63.0 in | Wt 162.3 lb

## 2022-04-27 DIAGNOSIS — G8929 Other chronic pain: Secondary | ICD-10-CM

## 2022-04-27 DIAGNOSIS — M25561 Pain in right knee: Secondary | ICD-10-CM

## 2022-04-27 DIAGNOSIS — Z712 Person consulting for explanation of examination or test findings: Secondary | ICD-10-CM | POA: Diagnosis not present

## 2022-04-27 DIAGNOSIS — M25511 Pain in right shoulder: Secondary | ICD-10-CM

## 2022-04-27 MED ORDER — METHYLPREDNISOLONE ACETATE 40 MG/ML IJ SUSP
40.0000 mg | Freq: Once | INTRAMUSCULAR | Status: AC
  Administered 2022-04-27: 40 mg via INTRA_ARTICULAR

## 2022-04-27 NOTE — Patient Instructions (Signed)
You have a frozen shoulder (adhesive capsulitis), a buildup of scar tissue that limits motion of the shoulder joint. Limit lifting and overhead activities as much as possible. Heat 15 minutes at a time 3-4 times a day may help with movement and stiffness. Tylenol and/or meloxicam as needed for pain and inflammation. Steroid injections in a series have been shown to help with pain and motion - you were given one of these today. Codman exercises (pendulum, wall walking or table slides, arm circles) - do 3 sets of 10 once or twice a day. Physical therapy for rotator cuff strengthening is a consideration once you are out of the painful phase Follow up in 1 month.  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 Do home exercises daily as directed. Add ankle weight if these become too easy. Consider formal physical therapy. Avoid flat shoes, barefoot walking as much as possible. Icing 15 minutes at a time 3-4 times a day as needed.

## 2022-04-27 NOTE — Progress Notes (Signed)
Patient was instructed in 10 minutes of therapeutic exercises for right shoulder/right knee pain to improve strength, ROM and function according to my instructions and plan of care by a Certified Athletic Trainer during the office visit.  Proper technique shown and discussed, handout provided.  All questions discussed and answered.

## 2022-04-27 NOTE — Progress Notes (Signed)
51 yo here to discuss results of recent ultrasound. Patient reports feeling well since her last visit. She remains amenorrheic, now for 8 months. She denies any pelvic pain or abnormal discharge. She denies experiencing the lower abdominal pain she expressed at her last visit. She is without any complaints  No past medical history on file. No past surgical history on file. Family History  Problem Relation Age of Onset   Cancer Maternal Aunt    Breast cancer Maternal Aunt    Social History   Tobacco Use   Smoking status: Never    Passive exposure: Never   Smokeless tobacco: Never  Vaping Use   Vaping Use: Never used  Substance Use Topics   Alcohol use: Never   Drug use: Never   ROS See pertinent in HPI. All other systems reviewed and non contributory Blood pressure 125/80, pulse 71, height 5\' 3"  (1.6 m), weight 162 lb 4.8 oz (73.6 kg).  GENERAL: Well-developed, well-nourished female in no acute distress.  NEURO: alert and oriented x 3  DG Shoulder Right  Result Date: 03/06/2022 CLINICAL DATA:  Right shoulder pain. EXAM: RIGHT SHOULDER - 2+ VIEW COMPARISON:  None. FINDINGS: Glenohumeral and acromioclavicular joint spaces are appropriately aligned. Minimal inferior glenoid and humeral head-neck junction degenerative spurring. No significant osteoarthritis of the acromioclavicular joint. No acute fracture or dislocation. The visualized portion of the right lung is unremarkable. IMPRESSION: Minimal inferior glenohumeral degenerative spurring. Electronically Signed   By: 03/08/2022 M.D.   On: 03/06/2022 10:55   MM 3D SCREEN BREAST BILATERAL  Result Date: 04/07/2022 CLINICAL DATA:  Screening. EXAM: DIGITAL SCREENING BILATERAL MAMMOGRAM WITH TOMOSYNTHESIS AND CAD TECHNIQUE: Bilateral screening digital craniocaudal and mediolateral oblique mammograms were obtained. Bilateral screening digital breast tomosynthesis was performed. The images were evaluated with computer-aided detection. Best  images possible per technologist communication. COMPARISON:  None. ACR Breast Density Category b: There are scattered areas of fibroglandular density. FINDINGS: There are no findings suspicious for malignancy. IMPRESSION: No mammographic evidence of malignancy. A result letter of this screening mammogram will be mailed directly to the patient. RECOMMENDATION: Screening mammogram in one year. (Code:SM-B-01Y) BI-RADS CATEGORY  1: Negative. Electronically Signed   By: 06/07/2022 M.D.   On: 04/07/2022 09:06   06/07/2022 PELVIC COMPLETE WITH TRANSVAGINAL  Result Date: 04/13/2022 CLINICAL DATA:  Pelvic pain in a female, secondary oligomenorrhea, N91.4, R10.2, unknown LMP, question perienopausal EXAM: TRANSABDOMINAL AND TRANSVAGINAL ULTRASOUND OF PELVIS TECHNIQUE: Both transabdominal and transvaginal ultrasound examinations of the pelvis were performed. Transabdominal technique was performed for global imaging of the pelvis including uterus, ovaries, adnexal regions, and pelvic cul-de-sac. It was necessary to proceed with endovaginal exam following the transabdominal exam to visualize the uterus and LEFT ovary. COMPARISON:  None FINDINGS: Uterus Measurements: 6.0 x 3.6 x 4.2 cm = volume: 48 mL. Retroverted. Mildly heterogeneous myometrium. Subserosal leiomyoma at anterior fundus 19 x 13 x 14 mm. No additional mass. Endometrium Thickness: 2 mm.  No endometrial fluid or mass Right ovary Measurements: 3.4 x 2.1 x 3.1 cm = volume: 11.6 mL. Simple cyst 3.1 cm diameter; no follow-up imaging recommended. Left ovary Measurements: 2.4 x 1.5 x 1.0 cm = volume: 1.8 mL. Normal morphology without mass Other findings No free pelvic fluid.  No adnexal masses. IMPRESSION: Subserosal leiomyoma anterior uterine fundus 19 mm greatest diameter. Remainder of exam normal. Electronically Signed   By: 06/13/2022 M.D.   On: 04/13/2022 14:31     A/P 51 yo here to discuss test  results - Reviewed ultrasound report - reviewed labs -  Reassurance provided as she is in perimenopause - No interventions needed for small fibroid uterus - No GYN explanation for intermittent lower abdominal pain - Follow up colonoscopy as scheduled

## 2022-04-27 NOTE — Progress Notes (Signed)
PCP: Camillia Herter, NP  Subjective:   HPI: Patient is a 51 y.o. female here for right shoulder and right knee pain.  R Shoulder Pain Started 5 months ago. Pain began gradually but then worsened and has been same severity for several months now. There was no inciting injury or trauma.  The pain is only present with movement, particularly reaching back or if she moves fast in any direction. Most of the pain is in lateral shoulder area and radiates into upper arm. She was seen in ED 2 months ago and given Meloxicam for presumed rotator cuff tendinitis. Found it helpful but symptoms recurred if she didn't take the Meloxicam. She is right hand dominant but does not do any repetitive or overhead motions. She has been doing pendulum exercises twice daily without much improvement. Denies numbness, tingling, or overt weakness.  R Knee Pain Present for 7-8 months. No known injury or trauma. Located in anterior knee, mostly at inferior aspect. Occasionally has pain posteriorly as well. The pain occurs when walking, particularly with stairs, and when standing from sitting position. Has tried gentle massage without improvement. No swelling, catching, locking.  No past medical history on file.  Current Outpatient Medications on File Prior to Visit  Medication Sig Dispense Refill   ibuprofen (ADVIL) 600 MG tablet Take 1 tablet (600 mg total) by mouth every 8 (eight) hours as needed. (Patient not taking: Reported on 04/06/2022) 30 tablet 0   meloxicam (MOBIC) 15 MG tablet Take 1 tablet (15 mg total) by mouth daily. 30 tablet 0   pantoprazole (PROTONIX) 40 MG tablet Take 1 tablet (40 mg total) by mouth daily. 30 tablet 3   No current facility-administered medications on file prior to visit.    No past surgical history on file.  No Known Allergies  BP 128/82   Ht 5\' 4"  (1.626 m)   Wt 160 lb 15 oz (73 kg)   BMI 27.62 kg/m      Objective:  Physical Exam:  Gen: NAD, comfortable in exam room  R  Shoulder: Inspection shows no deformity, swelling, atrophy or skin changes Mild diffuse TTP Decreased ROM in external rotation and abduction. FROM with internal rotation NV intact distally Special Tests: . - Supraspinatus: 5/5 strength with resisted flexion at 20 degrees - Infraspinatous/Teres Minor: 5/5 strength with ER - Subscapularis: 5/5 strength with IR - Biceps tendon: Negative Speeds - AC Joint: Negative cross arm  R Knee: Inspection normal without deformity, swelling or skin changes. No effusion. No tenderness to palpation. FROM. 5/5 strength with flexion and extension. Normal anterior and posterior drawer. No varus or valgus laxity.    Assessment & Plan:  1. Right Shoulder Pain--- patient with several month history of shoulder pain, now with limited ROM noted on exam even passively. Presentation concerning for frozen shoulder, from what likely began as impingement. Intra-articular steroid injection performed today. Recommended Codman exercises. Follow up in 1 month.  After informed written consent timeout was performed, patient was seated in chair in exam room. Right shoulder was prepped with alcohol swab and utilizing posterior approach with ultrasound guidance, patient's right glenohumeral space was injected with 3:1 lidocaine: depomedrol. Patient tolerated the procedure well without immediate complications.  2. Patellofemoral Syndrome-- patient provides classic history for patellofemoral syndrome of right knee. Given home exercises for VMO and glut medius strengthening.  Alcus Dad, MD PGY-2 [facility]

## 2022-06-01 ENCOUNTER — Ambulatory Visit: Payer: Medicaid Other | Admitting: Family Medicine

## 2022-11-24 ENCOUNTER — Ambulatory Visit: Payer: Self-pay

## 2022-11-24 NOTE — Telephone Encounter (Signed)
  Chief Complaint: Resolved lump under left arm Symptoms: none Frequency:  Pertinent Negatives: Patient denies  Disposition: [] ED /[] Urgent Care (no appt availability in office) / [] Appointment(In office/virtual)/ []  [facility]/ [] Home Care/ [] Refused Recommended Disposition /[] [place] [facility]/ [x]  Follow-up with PCP Additional Notes: Spoke with pr using pacific interpreters. Pt states she is not having any stomach issues. Pt states she only called to get an appointment to have someone look at a lump under her left arm which has resolved. Pt said it drained and is now fine.    Summary: Stomach issues   Caller states pt is "having trouble w/ her stomach"  Caller is not w/ pt and not listed on DPR  Caller states pt can be reached at [phone]  Pt requires interpreter       Answer Assessment - Initial Assessment Questions 1. APPEARANCE of BOIL: "What does the boil look like?"      Resolved 2. LOCATION: "Where is the boil located?"      Under left arm 3. NUMBER: "How many boils are there?"      Was 1 - now none 4. SIZE: "How big is the boil?" (e.g., inches, cm; compare to size of a coin or other object)      5. ONSET: "When did the boil start?"      6. PAIN: "Is there any pain?" If Yes, ask: "How bad is the pain?"   (Scale 1-10; or mild, moderate, severe)      7. FEVER: "Do you have a fever?" If Yes, ask: "What is it, how was it measured, and when did it start?"       8. SOURCE: "Have you been around anyone with boils or other Staph infections?" "Have you ever had boils before?"      9. OTHER SYMPTOMS: "Do you have any other symptoms?" (e.g., shaking chills, weakness, rash elsewhere on body)      10. PREGNANCY: "Is there any chance you are pregnant?" "When was your last menstrual period?"  Protocols used: Boil (Skin Abscess)-A-AH

## 2022-11-25 NOTE — Progress Notes (Deleted)
Patient ID: Shannon Riley, female    DOB: [birth year]  MRN: [id number]  CC: No chief complaint on file.   Subjective: Shannon Riley is a 51 y.o. female who presents for  Her concerns today include:  11/24/2022 per triage RN note: Chief Complaint: Resolved lump under left arm Symptoms: none Frequency:  Pertinent Negatives: Patient denies  Disposition: [] ED /[] Urgent Care (no appt availability in office) / [] Appointment(In office/virtual)/ []  [facility]/ [] Home Care/ [] Refused Recommended Disposition /[] [facility]/ [x]  Follow-up with PCP Additional Notes: Spoke with pr using pacific interpreters. Pt states she is not having any stomach issues. Pt states she only called to get an appointment to have someone look at a lump under her left arm which has resolved. Pt said it drained and is now fine.           Summary: Stomach issues     Caller states pt is "having trouble w/ her stomach"  Caller is not w/ pt and not listed on DPR  Caller states pt can be reached at [phone]  Pt requires interpreter        Answer Assessment - Initial Assessment Questions 1. APPEARANCE of BOIL: "What does the boil look like?"      Resolved 2. LOCATION: "Where is the boil located?"      Under left arm 3. NUMBER: "How many boils are there?"      Was 1 - now none 4. SIZE: "How big is the boil?" (e.g., inches, cm; compare to size of a coin or other object)      5. ONSET: "When did the boil start?"      6. PAIN: "Is there any pain?" If Yes, ask: "How bad is the pain?"   (Scale 1-10; or mild, moderate, severe)      7. FEVER: "Do you have a fever?" If Yes, ask: "What is it, how was it measured, and when did it start?"       8. SOURCE: "Have you been around anyone with boils or other Staph infections?" "Have you ever had boils before?"      9. OTHER SYMPTOMS: "Do you have any other symptoms?" (e.g., shaking chills, weakness, rash elsewhere on body)      10.  PREGNANCY: "Is there any chance you are pregnant?" "When was your last menstrual period?"  Today's visit 12/01/2022:  Patient Active Problem List   Diagnosis Date Noted   Prediabetes 02/05/2022     Current Outpatient Medications on File Prior to Visit  Medication Sig Dispense Refill   ibuprofen (ADVIL) 600 MG tablet Take 1 tablet (600 mg total) by mouth every 8 (eight) hours as needed. (Patient not taking: Reported on 04/06/2022) 30 tablet 0   meloxicam (MOBIC) 15 MG tablet Take 1 tablet (15 mg total) by mouth daily. 30 tablet 0   pantoprazole (PROTONIX) 40 MG tablet Take 1 tablet (40 mg total) by mouth daily. (Patient not taking: Reported on 04/27/2022) 30 tablet 3   No current facility-administered medications on file prior to visit.    No Known Allergies  Social History   Socioeconomic History   Marital status: Married    Spouse name: Not on file   Number of children: Not on file   Years of education: Not on file   Highest education level: Not on file  Occupational History   Not on file  Tobacco Use   Smoking status: Never    Passive exposure: Never  Smokeless tobacco: Never  Vaping Use   Vaping Use: Never used  Substance and Sexual Activity   Alcohol use: Never   Drug use: Never   Sexual activity: Not Currently    Birth control/protection: None  Other Topics Concern   Not on file  Social History Narrative   Not on file   Social Determinants of Health   Financial Resource Strain: Not on file  Food Insecurity: Not on file  Transportation Needs: Not on file  Physical Activity: Not on file  Stress: Not on file  Social Connections: Not on file  Intimate Partner Violence: Not on file    Family History  Problem Relation Age of Onset   Cancer Maternal Aunt    Breast cancer Maternal Aunt     No past surgical history on file.  ROS: Review of Systems Negative except as stated above  PHYSICAL EXAM: There were no vitals taken for this visit.  Physical  Exam  {female adult master:310786} {female adult master:310785}     Latest Ref Rng & Units 04/06/2022    3:14 PM 02/04/2022    2:07 PM  CMP  Glucose 70 - 99 mg/dL 97  95   BUN 6 - 24 mg/dL 9  11   Creatinine 7.61 - 1.00 mg/dL 4.70  9.29   Sodium 574 - 144 mmol/L 139  141   Potassium 3.5 - 5.2 mmol/L 4.1  4.4   Chloride 96 - 106 mmol/L 103  104   CO2 20 - 29 mmol/L 25  24   Calcium 8.7 - 10.2 mg/dL 9.4  9.1   Total Protein 6.0 - 8.5 g/dL 7.2  7.3   Total Bilirubin 0.0 - 1.2 mg/dL 0.4  <7.3   Alkaline Phos 44 - 121 IU/L 82  82   AST 0 - 40 IU/L 22  20   ALT 0 - 32 IU/L 14  19    Lipid Panel     Component Value Date/Time   CHOL 181 02/04/2022 1407   TRIG 157 (H) 02/04/2022 1407   HDL 49 02/04/2022 1407   CHOLHDL 3.7 02/04/2022 1407   LDLCALC 105 (H) 02/04/2022 1407    CBC    Component Value Date/Time   WBC 5.6 04/06/2022 1514   RBC 4.76 04/06/2022 1514   HGB 13.1 04/06/2022 1514   HCT 38.4 04/06/2022 1514   PLT 284 04/06/2022 1514   MCV 81 04/06/2022 1514   MCH 27.5 04/06/2022 1514   MCHC 34.1 04/06/2022 1514   RDW 13.0 04/06/2022 1514    ASSESSMENT AND PLAN:  There are no diagnoses linked to this encounter.   Patient was given the opportunity to ask questions.  Patient verbalized understanding of the plan and was able to repeat key elements of the plan. Patient was given clear instructions to go to Emergency Department or return to medical center if symptoms don't improve, worsen, or new problems develop.The patient verbalized understanding.   No orders of the defined types were placed in this encounter.    Requested Prescriptions    No prescriptions requested or ordered in this encounter    No follow-ups on file.  Rema Fendt, NP

## 2022-12-01 ENCOUNTER — Ambulatory Visit: Payer: Medicaid Other | Admitting: Family

## 2023-01-18 NOTE — Congregational Nurse Program (Signed)
Client came in to check BP - WNL.

## 2023-01-22 ENCOUNTER — Ambulatory Visit: Payer: Medicaid Other | Admitting: Family

## 2023-01-27 NOTE — Progress Notes (Signed)
Patient ID: Shannon Riley, female    DOB: [birth year]  MRN: [id number]  CC: Follow-Up  Subjective: Shannon Riley is a 52 y.o. female who presents for follow-up.   Her concerns today include:  Intermittent bilateral hand and feet numbness for 2 to 3 months. Worse during the night. She denies red flag symptoms. 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 No further issues/concerns for discussion today.   Patient Active Problem List   Diagnosis Date Noted   Prediabetes 02/05/2022     Current Outpatient Medications on File Prior to Visit  Medication Sig Dispense Refill   ibuprofen (ADVIL) 600 MG tablet Take 1 tablet (600 mg total) by mouth every 8 (eight) hours as needed. (Patient not taking: Reported on 04/06/2022) 30 tablet 0   meloxicam (MOBIC) 15 MG tablet Take 1 tablet (15 mg total) by mouth daily. 30 tablet 0   pantoprazole (PROTONIX) 40 MG tablet Take 1 tablet (40 mg total) by mouth daily. (Patient not taking: Reported on 04/27/2022) 30 tablet 3   No current facility-administered medications on file prior to visit.    No Known Allergies  Social History   Socioeconomic History   Marital status: Married    Spouse name: Not on file   Number of children: Not on file   Years of education: Not on file   Highest education level: Not on file  Occupational History   Not on file  Tobacco Use   Smoking status: Never    Passive exposure: Never   Smokeless tobacco: Never  Vaping Use   Vaping Use: Never used  Substance and Sexual Activity   Alcohol use: Never   Drug use: Never   Sexual activity: Not Currently    Birth control/protection: None  Other Topics Concern   Not on file  Social History Narrative   Not on file   Social Determinants of Health   Financial Resource Strain: Not on file  Food Insecurity: Not on file  Transportation Needs: Not on file  Physical Activity: Not on file  Stress: Not on file  Social Connections: Not  on file  Intimate Partner Violence: Not on file    Family History  Problem Relation Age of Onset   Cancer Maternal Aunt    Breast cancer Maternal Aunt     No past surgical history on file.  ROS: Review of Systems Negative except as stated above  PHYSICAL EXAM: BP 111/79 (BP Location: Left Arm, Patient Position: Sitting, Cuff Size: Large)   Pulse 61   Temp 98.3 F (36.8 C)   Resp 16   Ht 5' 2.99" (1.6 m)   Wt 153 lb (69.4 kg)   SpO2 97%   BMI 27.11 kg/m   Physical Exam HENT:     Head: Normocephalic and atraumatic.  Eyes:     Extraocular Movements: Extraocular movements intact.     Conjunctiva/sclera: Conjunctivae normal.     Pupils: Pupils are equal, round, and reactive to light.  Cardiovascular:     Rate and Rhythm: Normal rate and regular rhythm.     Pulses: Normal pulses.     Heart sounds: Normal heart sounds.  Pulmonary:     Effort: Pulmonary effort is normal.     Breath sounds: Normal breath sounds.  Musculoskeletal:     Right shoulder: Normal.     Left shoulder: Normal.     Right upper arm: Normal.     Left upper  arm: Normal.     Right elbow: Normal.     Left elbow: Normal.     Right forearm: Normal.     Left forearm: Normal.     Right wrist: Normal.     Left wrist: Normal.     Right hand: Normal.     Left hand: Normal.     Cervical back: Normal range of motion and neck supple.     Right hip: Normal.     Left hip: Normal.     Right upper leg: Normal.     Left upper leg: Normal.     Right knee: Normal.     Left knee: Normal.     Right lower leg: Normal.     Left lower leg: Normal.     Right ankle: Normal.     Left ankle: Normal.     Right foot: Normal.     Left foot: Normal.  Skin:    General: Skin is warm and dry.  Neurological:     General: No focal deficit present.     Mental Status: She is alert and oriented to person, place, and time.  Psychiatric:        Mood and Affect: Mood normal.        Behavior: Behavior normal.       ASSESSMENT AND PLAN: 1. Bilateral carpal tunnel syndrome 2. Numbness of left foot 3. Numbness of right foot - Gabapentin as prescribed. Counseled on medication adherence/adverse effects. - Routine screening.  - Follow-up with primary provider in 4 weeks or sooner if needed.  - gabapentin (NEURONTIN) 300 MG capsule; Take 1 capsule (300 mg total) by mouth at bedtime.  Dispense: 30 capsule; Refill: 0 - Hemoglobin A1c  4. Language barrier - [facility] in-person interpreter, [place].   Patient was given the opportunity to ask questions.  Patient verbalized understanding of the plan and was able to repeat key elements of the plan. Patient was given clear instructions to go to Emergency Department or return to medical center if symptoms don't improve, worsen, or new problems develop.The patient verbalized understanding.   Orders Placed This Encounter  Procedures   Hemoglobin A1c    Requested Prescriptions   Signed Prescriptions Disp Refills   gabapentin (NEURONTIN) 300 MG capsule 30 capsule 0    Sig: Take 1 capsule (300 mg total) by mouth at bedtime.    Follow-up with primary provider as scheduled.  Camillia Herter, NP

## 2023-02-03 ENCOUNTER — Ambulatory Visit (INDEPENDENT_AMBULATORY_CARE_PROVIDER_SITE_OTHER): Payer: Medicaid Other | Admitting: Family

## 2023-02-03 VITALS — BP 111/79 | HR 61 | Temp 98.3°F | Resp 16 | Ht 62.99 in | Wt 153.0 lb

## 2023-02-03 DIAGNOSIS — R200 Anesthesia of skin: Secondary | ICD-10-CM | POA: Diagnosis not present

## 2023-02-03 DIAGNOSIS — G5603 Carpal tunnel syndrome, bilateral upper limbs: Secondary | ICD-10-CM

## 2023-02-03 DIAGNOSIS — Z789 Other specified health status: Secondary | ICD-10-CM

## 2023-02-03 MED ORDER — GABAPENTIN 300 MG PO CAPS: 300.0000 mg | ORAL_CAPSULE | Freq: Every day | ORAL | 0 refills | Status: DC

## 2023-02-03 NOTE — Progress Notes (Signed)
Pt presents for dizziness -hands and feet tingling and numbness

## 2023-02-04 LAB — HEMOGLOBIN A1C
Est. average glucose Bld gHb Est-mCnc: 120 mg/dL
Hgb A1c MFr Bld: 5.8 % — ABNORMAL HIGH (ref 4.8–5.6)

## 2023-02-18 ENCOUNTER — Encounter: Payer: Medicaid Other | Admitting: Family

## 2023-03-30 NOTE — Progress Notes (Signed)
Patient ID: Shannon Riley, female    DOB: 1971-05-07  MRN: [id number]  CC: Annual Physical Exam  Subjective: Shannon Riley is a 52 y.o. female who presents for annual physical exam.   Her concerns today include:  - Carpal tunnel persisting. Taking Gabapentin without much relief.  - Intermittent itching bilateral hands. 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 She denies additional symptoms.   Patient Active Problem List   Diagnosis Date Noted   Prediabetes 02/05/2022     Current Outpatient Medications on File Prior to Visit  Medication Sig Dispense Refill   ibuprofen (ADVIL) 600 MG tablet Take 1 tablet (600 mg total) by mouth every 8 (eight) hours as needed. (Patient not taking: Reported on 04/06/2022) 30 tablet 0   meloxicam (MOBIC) 15 MG tablet Take 1 tablet (15 mg total) by mouth daily. (Patient not taking: Reported on 04/07/2023) 30 tablet 0   pantoprazole (PROTONIX) 40 MG tablet Take 1 tablet (40 mg total) by mouth daily. (Patient not taking: Reported on 04/27/2022) 30 tablet 3   No current facility-administered medications on file prior to visit.    No Known Allergies  Social History   Socioeconomic History   Marital status: Married    Spouse name: Not on file   Number of children: Not on file   Years of education: Not on file   Highest education level: Not on file  Occupational History   Not on file  Tobacco Use   Smoking status: Never    Passive exposure: Never   Smokeless tobacco: Never  Vaping Use   Vaping Use: Never used  Substance and Sexual Activity   Alcohol use: Never   Drug use: Never   Sexual activity: Not Currently    Birth control/protection: None  Other Topics Concern   Not on file  Social History Narrative   Not on file   Social Determinants of Health   Financial Resource Strain: Not on file  Food Insecurity: Not on file  Transportation Needs: Not on file  Physical Activity: Not on file  Stress: Not on file   Social Connections: Not on file  Intimate Partner Violence: Not on file    Family History  Problem Relation Age of Onset   Cancer Maternal Aunt    Breast cancer Maternal Aunt     No past surgical history on file.  ROS: Review of Systems Negative except as stated above  PHYSICAL EXAM: BP 118/79   Pulse 65   Resp 16   Ht 5' 3.5" (1.613 m)   Wt 152 lb 6.4 oz (69.1 kg)   SpO2 97%   BMI 26.57 kg/m   Physical Exam HENT:     Head: Normocephalic and atraumatic.     Right Ear: Tympanic membrane, ear canal and external ear normal.     Left Ear: Tympanic membrane, ear canal and external ear normal.     Nose: Nose normal.     Mouth/Throat:     Mouth: Mucous membranes are moist.     Pharynx: Oropharynx is clear.  Eyes:     Extraocular Movements: Extraocular movements intact.     Conjunctiva/sclera: Conjunctivae normal.     Pupils: Pupils are equal, round, and reactive to light.  Cardiovascular:     Rate and Rhythm: Normal rate and regular rhythm.     Pulses: Normal pulses.     Heart sounds: Normal heart sounds.  Pulmonary:     Effort: Pulmonary effort is  normal.     Breath sounds: Normal breath sounds.  Chest:     Comments: Patient declined.  Abdominal:     General: Bowel sounds are normal.     Palpations: Abdomen is soft.  Genitourinary:    Comments: Patient declined.  Musculoskeletal:        General: Normal range of motion.     Right shoulder: Normal.     Left shoulder: Normal.     Right upper arm: Normal.     Left upper arm: Normal.     Right elbow: Normal.     Left elbow: Normal.     Right forearm: Normal.     Left forearm: Normal.     Right wrist: Normal.     Left wrist: Normal.     Right hand: Normal.     Left hand: Normal.     Cervical back: Normal, normal range of motion and neck supple.     Thoracic back: Normal.     Lumbar back: Normal.     Right hip: Normal.     Left hip: Normal.     Right upper leg: Normal.     Left upper leg: Normal.      Right knee: Normal.     Left knee: Normal.     Right lower leg: Normal.     Left lower leg: Normal.     Right ankle: Normal.     Left ankle: Normal.     Right foot: Normal.     Left foot: Normal.  Skin:    General: Skin is warm and dry.     Capillary Refill: Capillary refill takes less than 2 seconds.  Neurological:     General: No focal deficit present.     Mental Status: She is alert and oriented to person, place, and time.  Psychiatric:        Mood and Affect: Mood normal.        Behavior: Behavior normal.     ASSESSMENT AND PLAN: 1. Annual physical exam - Counseled on 150 minutes of exercise per week as tolerated, healthy eating (including decreased daily intake of saturated fats, cholesterol, added sugars, sodium), STI prevention, and routine healthcare maintenance.  2. Screening for metabolic disorder - Routine screening.  - CMP14+EGFR  3. Screening for deficiency anemia - Routine screening.  - CBC  4. Screening cholesterol level - Routine screening.  - Lipid panel  5. Thyroid disorder screen - Routine screening.  - TSH  6. Colon cancer screening - Referral to Gastroenterology for colon cancer screening by colonoscopy. - Ambulatory referral to Gastroenterology  7. Bilateral carpal tunnel syndrome - Increase Gabapentin from 300 mg daily to 300 mg twice daily.  - Referral to Neurology for further evaluation/management. During the interim follow-up with primary provider as scheduled.  - gabapentin (NEURONTIN) 300 MG capsule; Take 1 capsule (300 mg total) by mouth 2 (two) times daily.  Dispense: 60 capsule; Refill: 1 - Ambulatory referral to Neurology  8. Itching of both hands - Triamcinolone Cream as prescribed. Counseled on medication adherence.  - Follow-up with primary provider in 4 weeks or sooner if needed.  - triamcinolone cream (KENALOG) 0.1 %; Apply 1 Application topically 2 (two) times daily.  Dispense: 60 g; Refill: 0  9. Language barrier - [facility] in-person interpreter, Luellen Pucker.    Patient was given the opportunity to ask questions.  Patient verbalized understanding of the plan and was able to repeat key elements of the plan. Patient  was given clear instructions to go to Emergency Department or return to medical center if symptoms don't improve, worsen, or new problems develop.The patient verbalized understanding.   Orders Placed This Encounter  Procedures   CBC   Lipid panel   TSH   CMP14+EGFR   Ambulatory referral to Gastroenterology   Ambulatory referral to Neurology     Requested Prescriptions   Signed Prescriptions Disp Refills   triamcinolone cream (KENALOG) 0.1 % 60 g 0    Sig: Apply 1 Application topically 2 (two) times daily.   gabapentin (NEURONTIN) 300 MG capsule 60 capsule 1    Sig: Take 1 capsule (300 mg total) by mouth 2 (two) times daily.    Return in about 1 year (around 04/06/2024) for Physical per patient preference.  Rema Fendt, NP

## 2023-04-07 ENCOUNTER — Ambulatory Visit (INDEPENDENT_AMBULATORY_CARE_PROVIDER_SITE_OTHER): Payer: Medicaid Other | Admitting: Family

## 2023-04-07 ENCOUNTER — Encounter: Payer: Self-pay | Admitting: Family

## 2023-04-07 VITALS — BP 118/79 | HR 65 | Resp 16 | Ht 63.5 in | Wt 152.4 lb

## 2023-04-07 DIAGNOSIS — Z1329 Encounter for screening for other suspected endocrine disorder: Secondary | ICD-10-CM | POA: Diagnosis not present

## 2023-04-07 DIAGNOSIS — Z13220 Encounter for screening for lipoid disorders: Secondary | ICD-10-CM

## 2023-04-07 DIAGNOSIS — L299 Pruritus, unspecified: Secondary | ICD-10-CM

## 2023-04-07 DIAGNOSIS — Z13228 Encounter for screening for other metabolic disorders: Secondary | ICD-10-CM

## 2023-04-07 DIAGNOSIS — G5603 Carpal tunnel syndrome, bilateral upper limbs: Secondary | ICD-10-CM | POA: Diagnosis not present

## 2023-04-07 DIAGNOSIS — Z130 Encounter for screening for diseases of the blood and blood-forming organs and certain disorders involving the immune mechanism: Secondary | ICD-10-CM

## 2023-04-07 DIAGNOSIS — Z603 Acculturation difficulty: Secondary | ICD-10-CM

## 2023-04-07 DIAGNOSIS — Z758 Other problems related to medical facilities and other health care: Secondary | ICD-10-CM

## 2023-04-07 DIAGNOSIS — Z0000 Encounter for general adult medical examination without abnormal findings: Secondary | ICD-10-CM | POA: Diagnosis not present

## 2023-04-07 DIAGNOSIS — Z1211 Encounter for screening for malignant neoplasm of colon: Secondary | ICD-10-CM

## 2023-04-07 MED ORDER — TRIAMCINOLONE ACETONIDE 0.1 % EX CREA
1.0000 | TOPICAL_CREAM | Freq: Two times a day (BID) | CUTANEOUS | 0 refills | Status: DC
Start: 2023-04-07 — End: 2024-01-01

## 2023-04-07 MED ORDER — GABAPENTIN 300 MG PO CAPS
300.0000 mg | ORAL_CAPSULE | Freq: Two times a day (BID) | ORAL | 1 refills | Status: DC
Start: 2023-04-07 — End: 2024-04-07

## 2023-04-07 NOTE — Patient Instructions (Signed)
Preventive Care 40-53 Years Old, Female Preventive care refers to lifestyle choices and visits with your health care provider that can promote health and wellness. Preventive care visits are also called wellness exams. What can I expect for my preventive care visit? Counseling Your health care provider may ask you questions about your: Medical history, including: Past medical problems. Family medical history. Pregnancy history. Current health, including: Menstrual cycle. Method of birth control. Emotional well-being. Home life and relationship well-being. Sexual activity and sexual health. Lifestyle, including: Alcohol, nicotine or tobacco, and drug use. Access to firearms. Diet, exercise, and sleep habits. Work and work environment. Sunscreen use. Safety issues such as seatbelt and bike helmet use. Physical exam Your health care provider will check your: Height and weight. These may be used to calculate your BMI (body mass index). BMI is a measurement that tells if you are at a healthy weight. Waist circumference. This measures the distance around your waistline. This measurement also tells if you are at a healthy weight and may help predict your risk of certain diseases, such as type 2 diabetes and high blood pressure. Heart rate and blood pressure. Body temperature. Skin for abnormal spots. What immunizations do I need?  Vaccines are usually given at various ages, according to a schedule. Your health care provider will recommend vaccines for you based on your age, medical history, and lifestyle or other factors, such as travel or where you work. What tests do I need? Screening Your health care provider may recommend screening tests for certain conditions. This may include: Lipid and cholesterol levels. Diabetes screening. This is done by checking your blood sugar (glucose) after you have not eaten for a while (fasting). Pelvic exam and Pap test. Hepatitis B test. Hepatitis C  test. HIV (human immunodeficiency virus) test. STI (sexually transmitted infection) testing, if you are at risk. Lung cancer screening. Colorectal cancer screening. Mammogram. Talk with your health care provider about when you should start having regular mammograms. This may depend on whether you have a family history of breast cancer. BRCA-related cancer screening. This may be done if you have a family history of breast, ovarian, tubal, or peritoneal cancers. Bone density scan. This is done to screen for osteoporosis. Talk with your health care provider about your test results, treatment options, and if necessary, the need for more tests. Follow these instructions at home: Eating and drinking  Eat a diet that includes fresh fruits and vegetables, whole grains, lean protein, and low-fat dairy products. Take vitamin and mineral supplements as recommended by your health care provider. Do not drink alcohol if: Your health care provider tells you not to drink. You are pregnant, may be pregnant, or are planning to become pregnant. If you drink alcohol: Limit how much you have to 0-1 drink a day. Know how much alcohol is in your drink. In the [place] one drink equals one 12 oz bottle of beer (355 mL), one 5 oz glass of wine (148 mL), or one 1 oz glass of hard liquor (44 mL). Lifestyle Brush your teeth every morning and night with fluoride toothpaste. Floss one time each day. Exercise for at least 30 minutes 5 or more days each week. Do not use any products that contain nicotine or tobacco. These products include cigarettes, chewing tobacco, and vaping devices, such as e-cigarettes. If you need help quitting, ask your health care provider. Do not use drugs. If you are sexually active, practice safe sex. Use a condom or other form of protection to   prevent STIs. If you do not wish to become pregnant, use a form of birth control. If you plan to become pregnant, see your health care provider for a  prepregnancy visit. Take aspirin only as told by your health care provider. Make sure that you understand how much to take and what form to take. Work with your health care provider to find out whether it is safe and beneficial for you to take aspirin daily. Find healthy ways to manage stress, such as: Meditation, yoga, or listening to music. Journaling. Talking to a trusted person. Spending time with friends and family. Minimize exposure to UV radiation to reduce your risk of skin cancer. Safety Always wear your seat belt while driving or riding in a vehicle. Do not drive: If you have been drinking alcohol. Do not ride with someone who has been drinking. When you are tired or distracted. While texting. If you have been using any mind-altering substances or drugs. Wear a helmet and other protective equipment during sports activities. If you have firearms in your house, make sure you follow all gun safety procedures. Seek help if you have been physically or sexually abused. What's next? Visit your health care provider once a year for an annual wellness visit. Ask your health care provider how often you should have your eyes and teeth checked. Stay up to date on all vaccines. This information is not intended to replace advice given to you by your health care provider. Make sure you discuss any questions you have with your health care provider. Document Revised: 05/21/2021 Document Reviewed: 05/21/2021 Elsevier Patient Education  2023 Elsevier Inc.  

## 2023-04-07 NOTE — Progress Notes (Signed)
Pt is having b/l hand pain and numbness

## 2023-04-08 ENCOUNTER — Encounter: Payer: Self-pay | Admitting: Neurology

## 2023-04-13 LAB — CMP14+EGFR
ALT: 13 IU/L (ref 0–32)
AST: 18 IU/L (ref 0–40)
Albumin/Globulin Ratio: 1.5 (ref 1.2–2.2)
Albumin: 4.1 g/dL (ref 3.8–4.9)
Alkaline Phosphatase: 101 IU/L (ref 44–121)
BUN/Creatinine Ratio: 24 — ABNORMAL HIGH (ref 9–23)
BUN: 16 mg/dL (ref 6–24)
Bilirubin Total: <0.2 mg/dL (ref 0.0–1.2)
CO2: 18 mmol/L — ABNORMAL LOW (ref 20–29)
Calcium: 9 mg/dL (ref 8.7–10.2)
Chloride: 102 mmol/L (ref 96–106)
Creatinine, Ser: 0.66 mg/dL (ref 0.57–1.00)
Globulin, Total: 2.7 g/dL (ref 1.5–4.5)
Glucose: 108 mg/dL — ABNORMAL HIGH (ref 70–99)
Potassium: 4 mmol/L (ref 3.5–5.2)
Sodium: 139 mmol/L (ref 134–144)
Total Protein: 6.8 g/dL (ref 6.0–8.5)
eGFR: 105 mL/min/{1.73_m2} (ref >59)

## 2023-04-13 LAB — CBC
Hematocrit: 38.4 % (ref 34.0–46.6)
Hemoglobin: 12.6 g/dL (ref 11.1–15.9)
MCH: 26.9 pg (ref 26.6–33.0)
MCHC: 32.8 g/dL (ref 31.5–35.7)
MCV: 82 fL (ref 79–97)
Platelets: 276 10*3/uL (ref 150–450)
RBC: 4.68 x10E6/uL (ref 3.77–5.28)
RDW: 13.2 % (ref 11.7–15.4)
WBC: 6.2 10*3/uL (ref 3.4–10.8)

## 2023-04-13 LAB — LIPID PANEL
Chol/HDL Ratio: 3.5 ratio (ref 0.0–4.4)
Cholesterol, Total: 135 mg/dL (ref 100–199)
HDL: 39 mg/dL — ABNORMAL LOW (ref >39)
LDL Chol Calc (NIH): 72 mg/dL (ref 0–99)
Triglycerides: 139 mg/dL (ref 0–149)
VLDL Cholesterol Cal: 24 mg/dL (ref 5–40)

## 2023-04-13 LAB — TSH: TSH: 1.67 u[IU]/mL (ref 0.450–4.500)

## 2023-05-12 ENCOUNTER — Encounter: Payer: Self-pay | Admitting: Neurology

## 2023-05-12 ENCOUNTER — Ambulatory Visit (INDEPENDENT_AMBULATORY_CARE_PROVIDER_SITE_OTHER): Payer: Medicaid Other | Admitting: Neurology

## 2023-05-12 VITALS — BP 131/85 | HR 77 | Ht 63.5 in | Wt 150.0 lb

## 2023-05-12 DIAGNOSIS — R202 Paresthesia of skin: Secondary | ICD-10-CM | POA: Diagnosis not present

## 2023-05-12 NOTE — Progress Notes (Signed)
[facility] Clinic Note - Initial Visit   Date: 05/12/2023   Shannon Riley MRN: [id number] DOB: 1971-06-05   Dear Shannon Stabs, NP:  Thank you for your kind referral of Shannon Riley for consultation of carpal tunnel syndrome. Although her history is well known to you, please allow [place] to reiterate it for the purpose of our medical record. The patient was accompanied to the clinic by Shannon Riley, Shannon Riley, who also provides collateral information.     Shannon Riley is a 52 y.o. right-handed female from [place] with prediabetes and GERD presenting for evaluation of bilateral hand tingling/numbness.   IMPRESSION/PLAN: Bilateral hand paresthesias is most suggestive of carpal tunnel syndrome.  Symptoms have significantly improved since starting gabapentin 300mg  BID.  I offered NCS/EMG of the arms, but given her improvement it was mutually decided to hold on testing unless symptoms get worse.  I will see her back in 6 months, or sooner as needed.  ------------------------------------------------------------- History of present illness: Starting round early 2024, she began having tingling and numbness in both hands.  It would wake her up from sleeping.  She saw her PCP in February who started gabapentin 300mg  at bedtime.  At her follow-up visit in May, the dose was increased to gabapentin 300mg  BID which has nearly resolved her symptoms.  She does not wake up with hands falling asleep.  She denies hand weakness or neck pain.   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  She moved from [place] in 2022 and previously worked as an Insurance account manager.   Out-side paper records, electronic medical record, and images have been reviewed where available and summarized as:  Lab Results  Component Value Date   HGBA1C 5.8 (H) 02/03/2023   No results found for: "VITAMINB12" Lab Results  Component  Value Date   TSH 1.670 04/07/2023   No results found for: "ESRSEDRATE", "POCTSEDRATE"  History reviewed. No pertinent past medical history.  History reviewed. No pertinent surgical history.   Medications:  Outpatient Encounter Medications as of 05/12/2023  Medication Sig   gabapentin (NEURONTIN) 300 MG capsule Take 1 capsule (300 mg total) by mouth 2 (two) times daily.   triamcinolone cream (KENALOG) 0.1 % Apply 1 Application topically 2 (two) times daily.   ibuprofen (ADVIL) 600 MG tablet Take 1 tablet (600 mg total) by mouth every 8 (eight) hours as needed. (Patient not taking: Reported on 04/06/2022)   meloxicam (MOBIC) 15 MG tablet Take 1 tablet (15 mg total) by mouth daily. (Patient not taking: Reported on 04/07/2023)   pantoprazole (PROTONIX) 40 MG tablet Take 1 tablet (40 mg total) by mouth daily. (Patient not taking: Reported on 04/27/2022)   No facility-administered encounter medications on file as of 05/12/2023.    Allergies: No Known Allergies  Family History: Family History  Problem Relation Age of Onset   Cancer Maternal Aunt    Breast cancer Maternal Aunt     Social History: Social History   Tobacco Use   Smoking status: Never    Passive exposure: Never   Smokeless tobacco: Never  Vaping Use   Vaping Use: Never used  Substance Use Topics   Alcohol use: Never   Drug use: Never   Social History   Social History Narrative   Right Handed    Lives in a one story floor plan on the second floor in condominium     Vital Signs:  BP 131/85  Pulse 77   Ht 5' 3.5" (1.613 m)   Wt 150 lb (68 kg)   SpO2 98%   BMI 26.15 kg/m    Neurological Exam: MENTAL STATUS including orientation to time, place, person, recent and remote memory, attention span and concentration, language, and fund of knowledge is normal.  Speech is not dysarthric.  CRANIAL NERVES: II:  No visual field defects.     III-IV-VI: Pupils equal round and reactive to light.  Normal conjugate,  extra-ocular eye movements in all directions of gaze.  No nystagmus.  No ptosis.   V:  Normal facial sensation.    VII:  Normal facial symmetry and movements.   VIII:  Normal hearing and vestibular function.   IX-X:  Normal palatal movement.   XI:  Normal shoulder shrug and head rotation.   XII:  Normal tongue strength and range of motion, no deviation or fasciculation.  MOTOR:  Motor strength is 5/5 throughout, including bilateral ABP.  No atrophy, fasciculations or abnormal movements.  No pronator drift.   MSRs:                                           Right        Left brachioradialis 2+  2+  biceps 2+  2+  triceps 2+  2+  patellar 2+  2+  ankle jerk 2+  2+  Hoffman no  no  plantar response down  down   SENSORY:  Normal and symmetric perception of light touch, pinprick, vibration, and temperature.  Tinel's sign at the wrist is negative bilaterally.   COORDINATION/GAIT: Normal finger-to- nose-finger.  Intact rapid alternating movements bilaterally.  Gait narrow based and stable. Tandem and stressed gait intact.     Thank you for allowing me to participate in patient's care.  If I can answer any additional questions, I would be pleased to do so.    Sincerely,    Shannon Riley K. Shannon Katz, DO

## 2023-05-12 NOTE — Patient Instructions (Signed)
It was lovely to see you today!  Continue to monitor your symptoms and if they get worse, please come back and see me.  I will see you back in 6 months

## 2023-09-08 ENCOUNTER — Encounter: Payer: Self-pay | Admitting: Neurology

## 2023-11-22 ENCOUNTER — Ambulatory Visit: Payer: Medicaid Other | Admitting: Neurology

## 2023-12-14 ENCOUNTER — Ambulatory Visit: Payer: Medicaid Other | Admitting: Neurology

## 2023-12-30 ENCOUNTER — Ambulatory Visit: Payer: Self-pay | Admitting: Family

## 2023-12-30 NOTE — Telephone Encounter (Signed)
Chief Complaint: facial swelling Symptoms: moderate facial swelling with itching and redness Frequency: symptoms onset 2 days ago Pertinent Negatives: Patient denies difficulty breathing, SOB, throat tightening, tongue swelling, fever, open sores or blisters, no new medications Disposition: [] ED /[x] Urgent Care (no appt availability in office) / [] Appointment(In office/virtual)/ []  [place] [facility]/ [] Home Care/ [] Refused Recommended Disposition /[] [place] [facility]/ []  Follow-up with PCP  Additional Notes: Pt reports redness and swelling to bilateral cheeks for 2 days. Pt denies any open sores or blisters, but endorses 7/10 itching. When asked about pain, pt states "I don't have pain, I have burning. The burning is 8/10." Pt states she has never had these symptoms before. Pt denies SOB, difficulty breathing, throat tightening, tongue swelling, fever, new medications. Pt states swelling is not painful to the touch. Pt called into triage while at her son's school before a meeting. Pt reports taking allergy medication with good effect. Interpreter was used to facilitate the call this this Charity fundraiser. Per protocol, pt advised she needs to be seen by an HCP within 24 hours. No availability at pt's PCP office. Pt advised to go to an UC, pt agreeable to that plan and states she will take herself to UC. Pt advised to call back for any worsening symptoms. Pt verbalized understanding.   Copied from CRM [id number]. Topic: Clinical - Red Word Triage >> Dec 30, 2023  4:17 PM Maxwell Marion wrote: Red Word that prompted transfer to Nurse Triage: face swollen all over, red and itchy. Not sure if it is allergic reaction or not. For 3 days Reason for Disposition  Face swelling is painful to touch  Answer Assessment - Initial Assessment Questions 1. ONSET: "When did the swelling start?" (e.g., minutes, hours, days)     3 days ago 2. LOCATION: "What part of the face is swollen?"     Both cheeks are red and swollen, pt  denies blisters or open sores. Face only. 3. SEVERITY: "How swollen is it?"     Moderate swelling 4. ITCHING: "Is there any itching?" If Yes, ask: "How much?"   (Scale 1-10; mild, moderate or severe)     Yes, 7/10 itching 5. PAIN: "Is the swelling painful to touch?" If Yes, ask: "How painful is it?"   (Scale 1-10; mild, moderate or severe)   - NONE (0): no pain   - MILD (1-3): doesn't interfere with normal activities    - MODERATE (4-7): interferes with normal activities or awakens from sleep    - SEVERE (8-10): excruciating pain, unable to do any normal activities      "I don't have pain, but it is burning, so the burning is an 8/10" 6. FEVER: "Do you have a fever?" If Yes, ask: "What is it, how was it measured, and when did it start?"      No 7. CAUSE: "What do you think is causing the face swelling?"     I don't know 8. RECURRENT SYMPTOM: "Have you had face swelling before?" If Yes, ask: "When was the last time?" "What happened that time?"     No 9. OTHER SYMPTOMS: "Do you have any other symptoms?" (e.g., toothache, leg swelling)     Denies difficulty breathing, throat tightening, tongue swelling, no new medications. Pt reports taking an allergy medication with good effect.  Protocols used: Face Swelling-A-AH

## 2024-01-01 ENCOUNTER — Ambulatory Visit (HOSPITAL_COMMUNITY)
Admission: EM | Admit: 2024-01-01 | Discharge: 2024-01-01 | Disposition: A | Discharge status: Home / Self Care | Payer: Medicaid Other | Attending: Emergency Medicine | Admitting: Emergency Medicine

## 2024-01-01 DIAGNOSIS — L719 Rosacea, unspecified: Secondary | ICD-10-CM

## 2024-01-01 MED ORDER — CETIRIZINE HCL 10 MG PO TABS: 10.0000 mg | ORAL_TABLET | Freq: Every day | ORAL | 2 refills | Status: DC

## 2024-01-01 NOTE — ED Triage Notes (Signed)
Here for rash all over her face x 1 week. No new foods or medication have been used.

## 2024-01-01 NOTE — ED Provider Notes (Signed)
[facility]    CSN: [id number] Arrival date & time: 01/01/24  1559      History   Chief Complaint Chief Complaint  Patient presents with   Allergic Reaction   Rash    HPI Shannon Riley is a 53 y.o. female.  Family translates per patient request About a week of redness on the face only.  Itching and sometimes burning.  She thinks it started after she was standing outside in the cold.  Was taking a medicine at night, possibly Benadryl, that helped her a little bit.  She has not had this in the past. Maybe some increased stress recently Does not drink alcohol No new products or changes in food/medications  No past medical history on file.  Patient Active Problem List   Diagnosis Date Noted   Prediabetes 02/05/2022    No past surgical history on file.  OB History     Gravida  1   Para      Term      Preterm      AB      Living  1      SAB      IAB      Ectopic      Multiple      Live Births  1            Home Medications    Prior to Admission medications   Medication Sig Start Date End Date Taking? Authorizing Provider  cetirizine (ZYRTEC ALLERGY) 10 MG tablet Take 1 tablet (10 mg total) by mouth daily. 01/01/24  Yes Xaivier Malay, Lurena Joiner, PA-C  gabapentin (NEURONTIN) 300 MG capsule Take 1 capsule (300 mg total) by mouth 2 (two) times daily. 04/07/23   Rema Fendt, NP    Family History Family History  Problem Relation Age of Onset   Cancer Maternal Aunt    Breast cancer Maternal Aunt     Social History Social History   Tobacco Use   Smoking status: Never    Passive exposure: Never   Smokeless tobacco: Never  Vaping Use   Vaping status: Never Used  Substance Use Topics   Alcohol use: Never   Drug use: Never     Allergies   Patient has no known allergies.   Review of Systems Review of Systems  Skin:  Positive for rash.   Per HPI  Physical Exam Triage Vital Signs ED Triage Vitals [01/01/24 1737]   Encounter Vitals Group     BP (!) 161/93     Systolic BP Percentile      Diastolic BP Percentile      Pulse Rate 79     Resp 16     Temp 97.6 F (36.4 C)     Temp Source Oral     SpO2 98 %     Weight      Height      Head Circumference      Peak Flow      Pain Score      Pain Loc      Pain Education      Exclude from Growth Chart    No data found.  Updated Vital Signs BP 126/83 (BP Location: Right Arm)   Pulse 79   Temp 97.6 F (36.4 C) (Oral)   Resp 16   SpO2 98%     Physical Exam Vitals and nursing note reviewed.  Constitutional:      General: She is not in acute  distress.    Appearance: Normal appearance.  HENT:     Head:      Comments: Erythematous rash on the face, does not spare nasolabial folds but does spare vermillion border    Mouth/Throat:     Mouth: Mucous membranes are moist.     Pharynx: Oropharynx is clear.  Eyes:     Conjunctiva/sclera: Conjunctivae normal.     Pupils: Pupils are equal, round, and reactive to light.  Cardiovascular:     Rate and Rhythm: Normal rate and regular rhythm.     Pulses: Normal pulses.     Heart sounds: Normal heart sounds.  Pulmonary:     Effort: Pulmonary effort is normal.     Breath sounds: Normal breath sounds.  Musculoskeletal:        General: Normal range of motion.     Cervical back: Normal range of motion.  Skin:    Findings: Rash present.  Neurological:     Mental Status: She is alert and oriented to person, place, and time.      UC Treatments / Results  Labs (all labs ordered are listed, but only abnormal results are displayed) Labs Reviewed - No data to display  EKG  Radiology No results found.  Procedures Procedures (including critical care time)  Medications Ordered in UC Medications - No data to display  Initial Impression / Assessment and Plan / UC Course  I have reviewed the triage vital signs and the nursing notes.  Pertinent labs & imaging results that were available during  my care of the patient were reviewed by me and considered in my medical decision making (see chart for details).  Well appearing and stable vitals  Facial rash is consistent with rosacea Unknown cause but potentially cold weather, stress, spicy food. Discussed etiologies with patient and symptomatic treatment. Can try zyrtec and benadryl for itch. Advised contact PCP for follow up appointment, provided with dermatology information as well Discussed triggers to avoid  Final Clinical Impressions(s) / UC Diagnoses   Final diagnoses:  Rosacea     Discharge Instructions      I recommend to try zyrtec every day, and benadryl every night for the next week or so. This can help with itching symptoms.  There is some information about rosacea attached  Please contact primary care provider on Monday morning to set up an appointment.     ED Prescriptions     Medication Sig Dispense Auth. Provider   cetirizine (ZYRTEC ALLERGY) 10 MG tablet Take 1 tablet (10 mg total) by mouth daily. 30 tablet Micole Delehanty, Lurena Joiner, PA-C      PDMP not reviewed this encounter.   Kathrine Haddock 01/01/24 4098

## 2024-01-01 NOTE — Discharge Instructions (Addendum)
I recommend to try zyrtec every day, and benadryl every night for the next week or so. This can help with itching symptoms.  There is some information about rosacea attached  Please contact primary care provider on Monday morning to set up an appointment.

## 2024-01-04 NOTE — Telephone Encounter (Signed)
Report to Emergency Department/Urgent Care/call 911 for immediate medical evaluation. Follow-up with Primary Care.

## 2024-01-04 NOTE — Telephone Encounter (Signed)
Call return with interpreter [phone] I have attempted to contact this patient by phone with the following results: no answer.

## 2024-02-18 IMAGING — US US PELVIS COMPLETE WITH TRANSVAGINAL
1 series · 15 of 25 positions shown · non-contrast
Comparison: None

CLINICAL DATA: Pelvic pain in a female, secondary oligomenorrhea,
N91.4, R10.2, unknown LMP, question perienopausal

EXAM:
TRANSABDOMINAL AND TRANSVAGINAL ULTRASOUND OF PELVIS
TECHNIQUE: Both transabdominal and transvaginal ultrasound examinations of the
pelvis were performed. Transabdominal technique was performed for
global imaging of the pelvis including uterus, ovaries, adnexal
regions, and pelvic cul-de-sac. It was necessary to proceed with
endovaginal exam following the transabdominal exam to visualize the
uterus and LEFT ovary.

[Series 1: us pelvis complete with transvaginal · 15 of 104 slices shown]
[im 1/104]
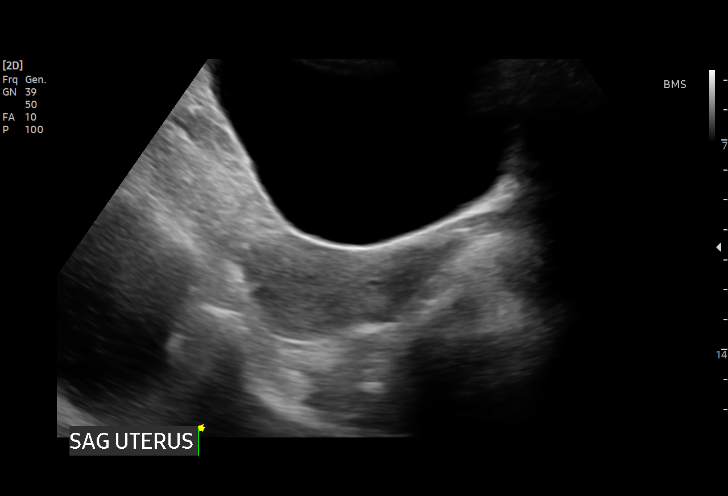
[im 9/104]
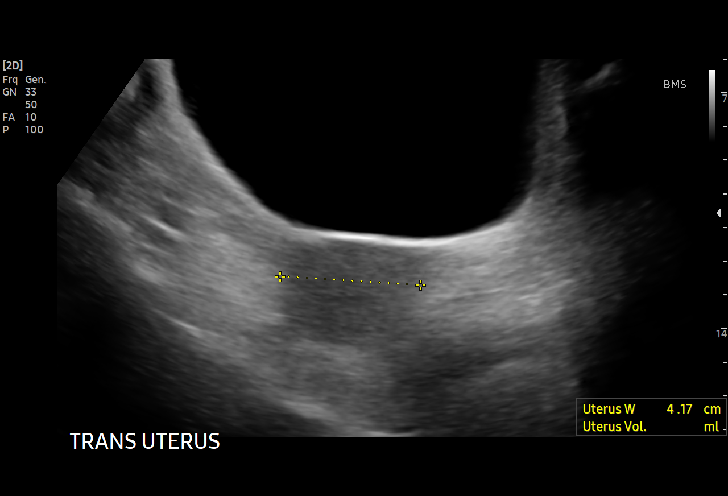
[im 18/104]
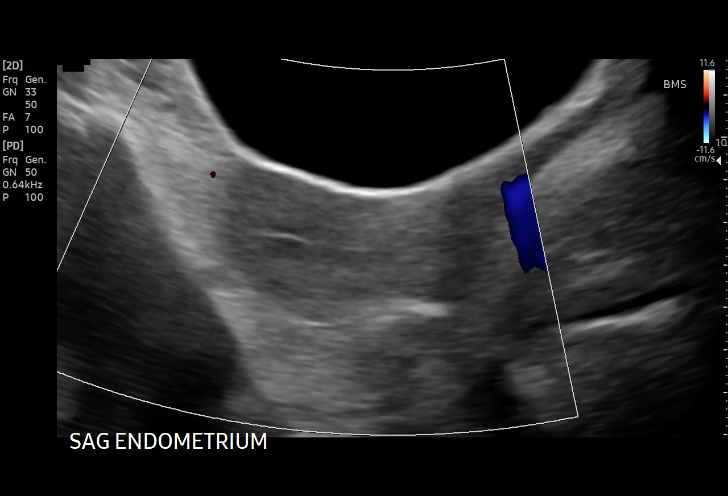
[im 22/104]
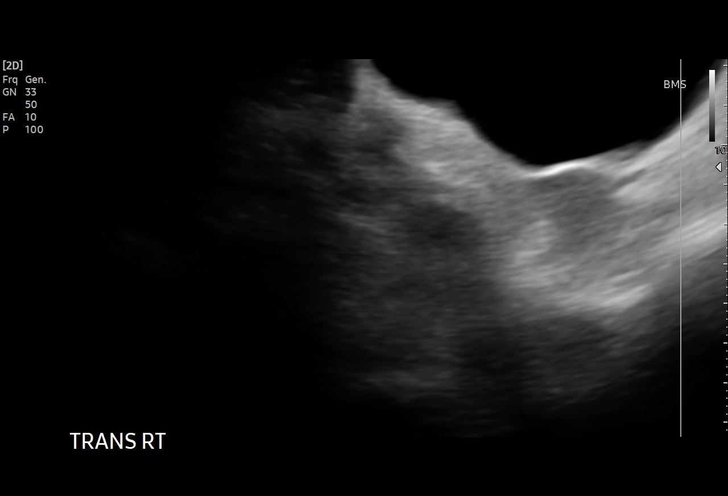
[im 31/104]
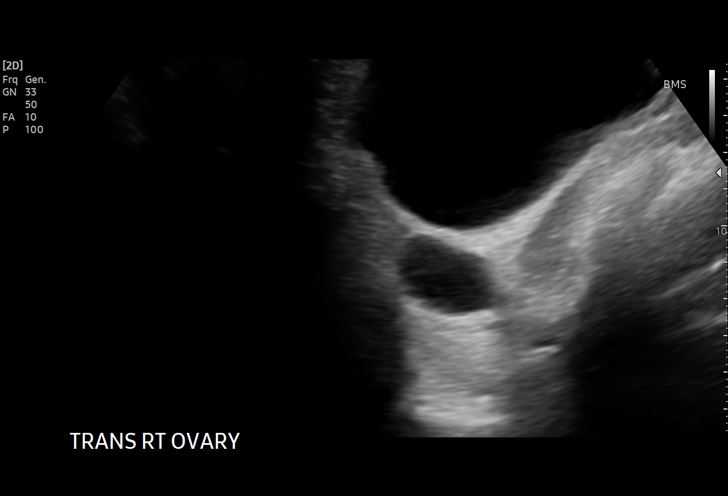
[im 39/104]
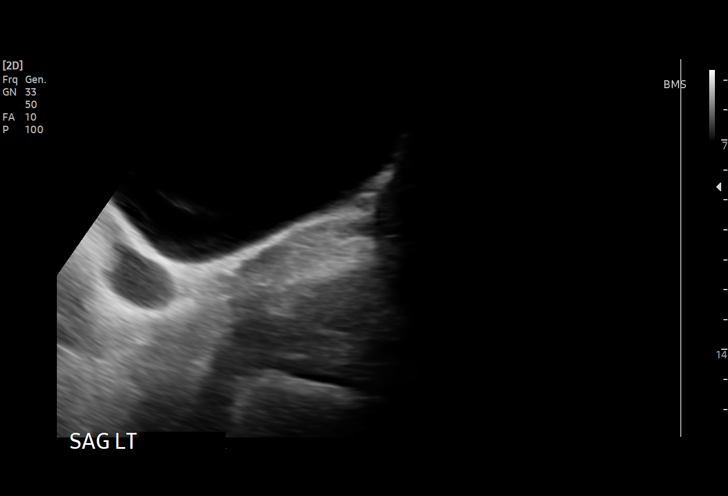
[im 43/104]
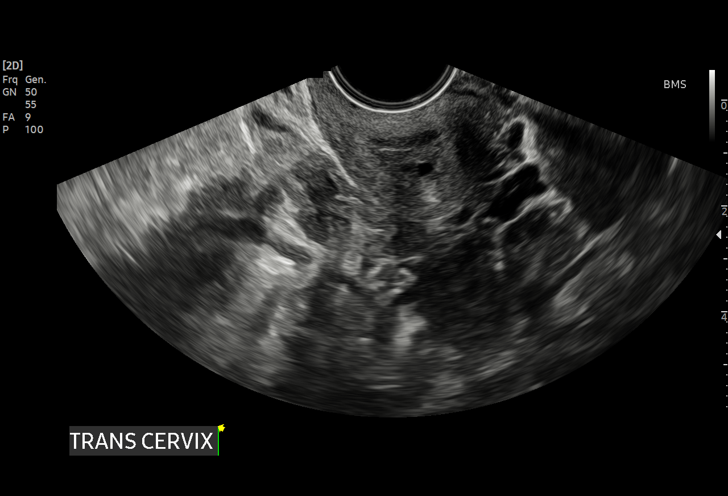
[im 52/104]
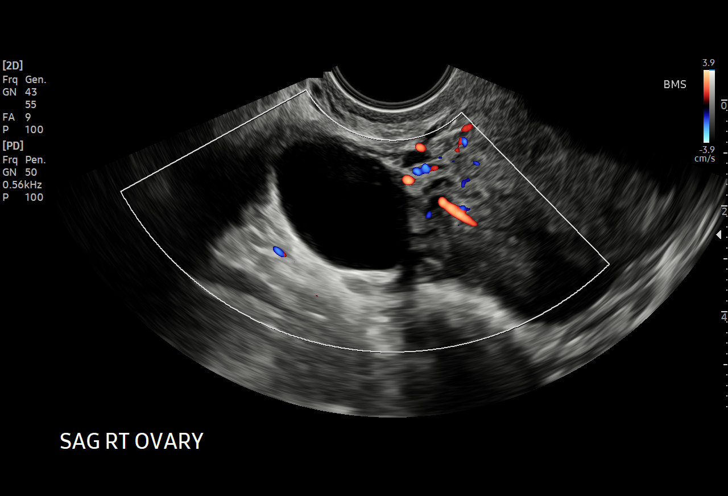
[im 61/104]
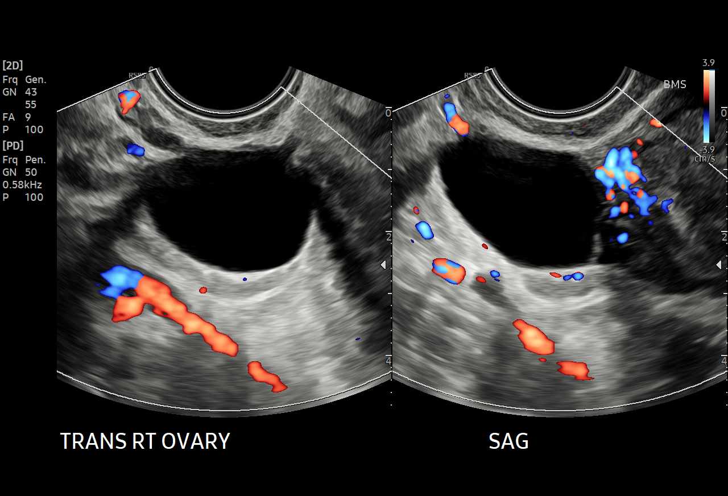
[im 65/104]
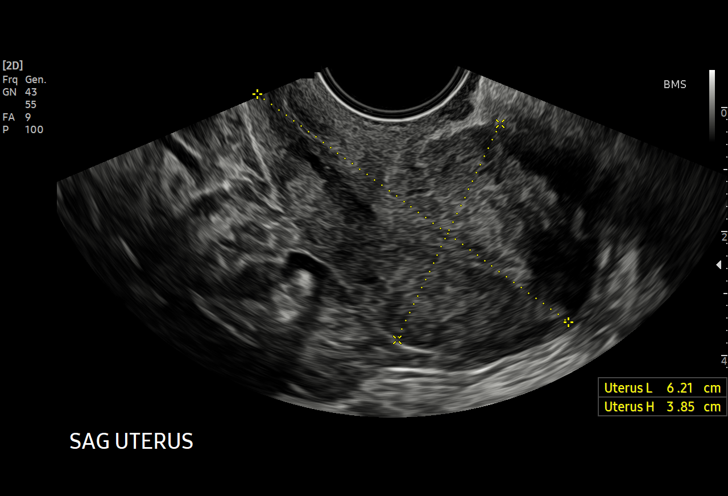
[im 73/104]
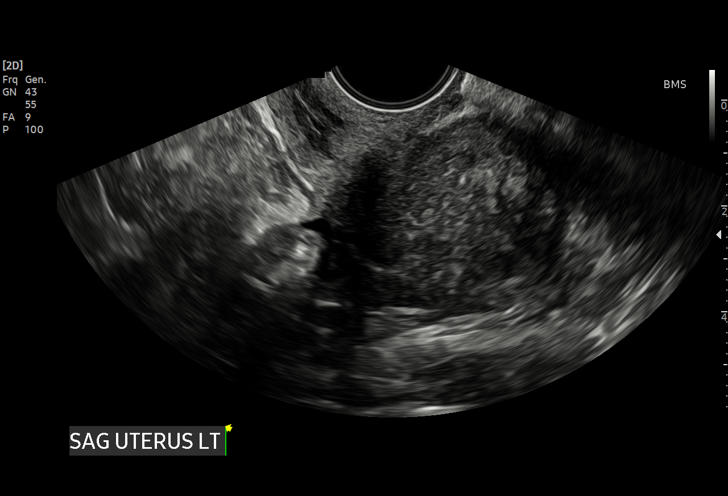
[im 82/104]
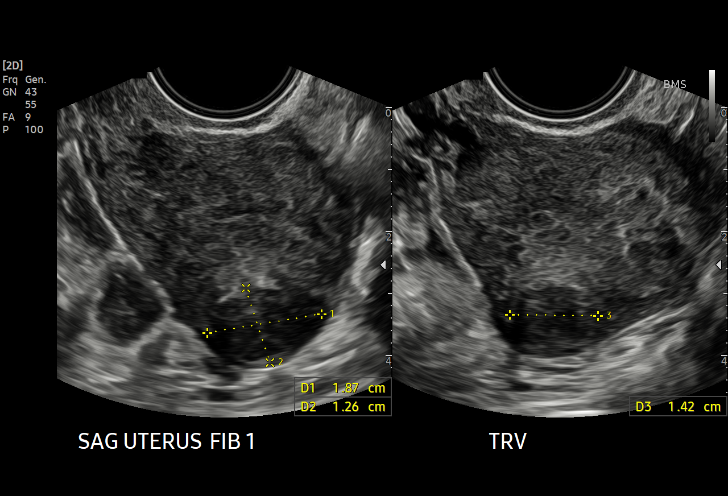
[im 86/104]
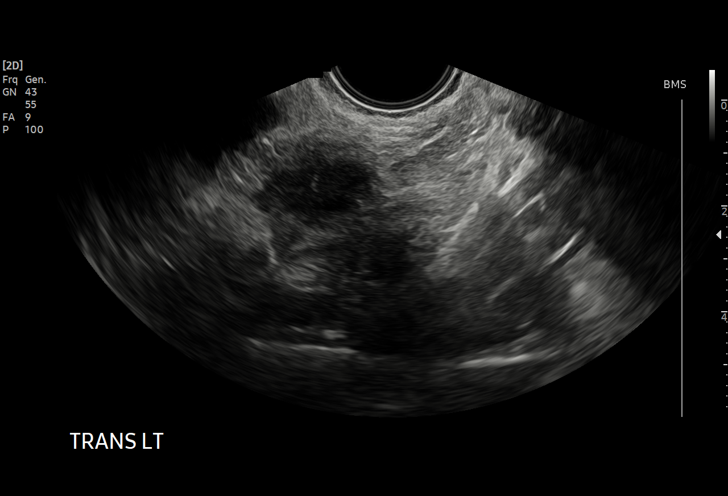
[im 95/104]
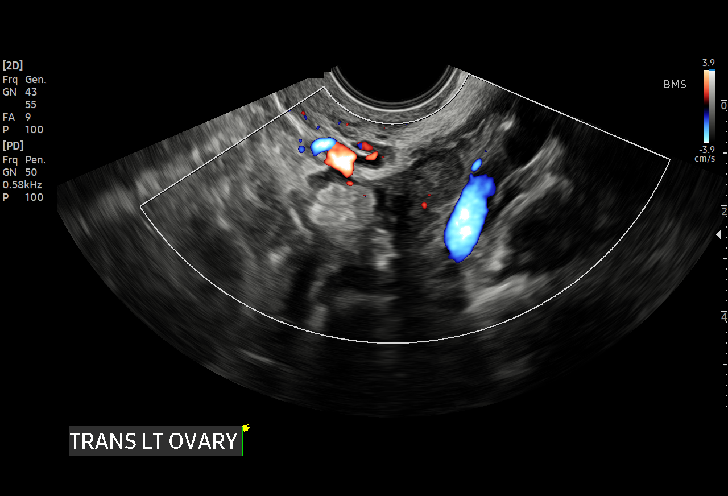
[im 104/104]
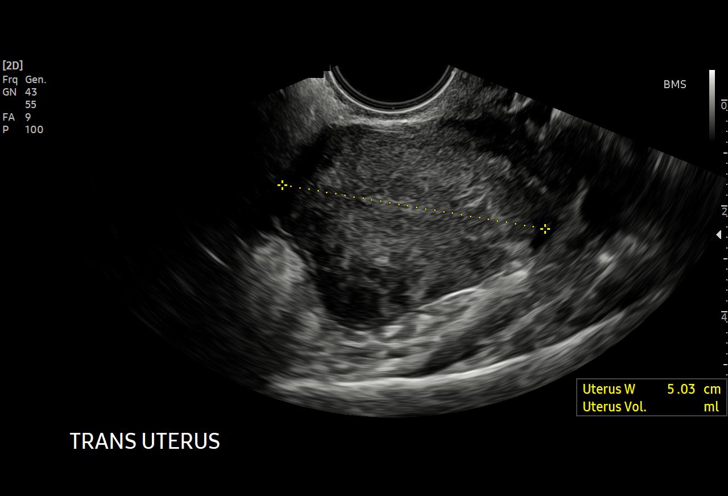

[15 of 25 positions shown; findings below may reference images not displayed]

FINDINGS: Uterus

Measurements: 6.0 x 3.6 x 4.2 cm = volume: 48 mL. Retroverted.
Mildly heterogeneous myometrium. Subserosal leiomyoma at anterior
fundus 19 x 13 x 14 mm. No additional mass.

Endometrium

Thickness: 2 mm.  No endometrial fluid or mass

Right ovary

Measurements: 3.4 x 2.1 x 3.1 cm = volume: 11.6 mL. Simple cyst
cm diameter; no follow-up imaging recommended.

Left ovary

Measurements: 2.4 x 1.5 x 1.0 cm = volume: 1.8 mL. Normal morphology
without mass

Other findings

No free pelvic fluid.  No adnexal masses.
IMPRESSION: Subserosal leiomyoma anterior uterine fundus 19 mm greatest
diameter.

Remainder of exam normal.

## 2024-03-20 ENCOUNTER — Encounter (HOSPITAL_COMMUNITY): Payer: Self-pay

## 2024-03-20 ENCOUNTER — Other Ambulatory Visit: Payer: Self-pay

## 2024-03-20 ENCOUNTER — Emergency Department (HOSPITAL_COMMUNITY)

## 2024-03-20 ENCOUNTER — Emergency Department (HOSPITAL_COMMUNITY)
Admission: EM | Admit: 2024-03-20 | Discharge: 2024-03-21 | Disposition: A | Discharge status: Home / Self Care | Attending: Emergency Medicine | Admitting: Emergency Medicine

## 2024-03-20 DIAGNOSIS — M79672 Pain in left foot: Secondary | ICD-10-CM | POA: Diagnosis present

## 2024-03-20 DIAGNOSIS — S92355A Nondisplaced fracture of fifth metatarsal bone, left foot, initial encounter for closed fracture: Secondary | ICD-10-CM | POA: Insufficient documentation

## 2024-03-20 DIAGNOSIS — X501XXA Overexertion from prolonged static or awkward postures, initial encounter: Secondary | ICD-10-CM | POA: Insufficient documentation

## 2024-03-20 MED ORDER — IBUPROFEN 400 MG PO TABS
600.0000 mg | ORAL_TABLET | Freq: Once | ORAL | Status: AC
  Administered 2024-03-20: 600 mg via ORAL
  Filled 2024-03-20: qty 1

## 2024-03-20 NOTE — ED Provider Triage Note (Signed)
 Emergency Medicine Provider Triage Evaluation Note  Shannon Riley Shannon Riley , a 53 y.o. female  was evaluated in triage.  Pt complains of L ankle pain after rolling ankle yesterday. Took tylenol at home. No hx of surgical interventions to left ankle. No other concerns.  Review of Systems  Positive:  Negative:   Physical Exam  BP (!) 136/97   Pulse 69   Temp 98.5 F (36.9 C) (Oral)   Resp 18   SpO2 100%  Gen:   Awake, no distress   Resp:  Normal effort  MSK:   Moves extremities without difficulty  Other:    Medical Decision Making  Medically screening exam initiated at 7:01 PM.  Appropriate orders placed.  Shannon Riley was informed that the remainder of the evaluation will be completed by another provider, this initial triage assessment does not replace that evaluation, and the importance of remaining in the ED until their evaluation is complete.     Adel Aden, PA-C 03/20/24 1902

## 2024-03-20 NOTE — ED Triage Notes (Signed)
 Fell yesterday and twisted her left ankle. Hurts to walk on it.

## 2024-03-21 ENCOUNTER — Emergency Department (HOSPITAL_COMMUNITY)

## 2024-03-21 MED ORDER — OXYCODONE-ACETAMINOPHEN 5-325 MG PO TABS
2.0000 | ORAL_TABLET | Freq: Once | ORAL | Status: AC
  Administered 2024-03-21: 2 via ORAL
  Filled 2024-03-21: qty 2

## 2024-03-21 MED ORDER — OXYCODONE-ACETAMINOPHEN 5-325 MG PO TABS: 1.0000 | ORAL_TABLET | Freq: Four times a day (QID) | ORAL | 0 refills | Status: DC | PRN

## 2024-03-21 NOTE — Progress Notes (Signed)
 Orthopedic Tech Progress Note Patient Details:  Kaitrin Seybold Jan Kawai 1970/12/30 [id number]  Ortho Devices Type of Ortho Device: Crutches, CAM walker Ortho Device/Splint Location: lle Ortho Device/Splint Interventions: Ordered, Application, Adjustment   Post Interventions Patient Tolerated: Well Instructions Provided: Care of device, Adjustment of device  Terryann Fiddler 03/21/2024, 1:31 AM

## 2024-03-21 NOTE — ED Provider Notes (Signed)
 [facility] Emergency Department Provider Note MRN:  [id number]  Arrival date & time: 03/21/24     Chief Complaint   Foot Pain   History of Present Illness   Shannon Riley is a 53 y.o. year-old female presents to the ED with chief complaint of left ankle and foot pain. She states that she rolled her foot and ankle yesterday.  She reports associated pain and swelling.  She denies any successful treatments PTA.  History provided by patient.   Review of Systems  Pertinent positive and negative review of systems noted in HPI.    Physical Exam   Vitals:   03/20/24 1903 03/20/24 2217  BP:  137/86  Pulse:  64  Resp:  16  Temp:  98.2 F (36.8 C)  SpO2: 100% 100%    CONSTITUTIONAL:  non toxic-appearing, NAD NEURO:  Alert and oriented x 3, CN 3-12 grossly intact EYES:  eyes equal and reactive ENT/NECK:  Supple, no stridor  CARDIO:  normal rate, appears well-perfused  PULM:  No respiratory distress,  GI/GU:  non-distended,  MSK/SPINE:  mild swelling of the left foot, TTP of the left foot laterally, no ankle TTP SKIN:  no rash, atraumatic   *Additional and/or pertinent findings included in MDM below  Diagnostic and Interventional Summary    EKG Interpretation Date/Time:    Ventricular Rate:    PR Interval:    QRS Duration:    QT Interval:    QTC Calculation:   R Axis:      Text Interpretation:         Labs Reviewed - No data to display  DG Foot Complete Left  Final Result    DG Ankle Complete Left  Final Result      Medications  ibuprofen (ADVIL) tablet 600 mg (600 mg Oral Given 03/20/24 1909)  oxyCODONE-acetaminophen (PERCOCET/ROXICET) 5-325 MG per tablet 2 tablet (2 tablets Oral Given 03/21/24 0031)     Procedures  /  Critical Care Procedures  ED Course and Medical Decision Making  I have reviewed the triage vital signs, the nursing notes, and pertinent available records from the EMR.  Social Determinants Affecting  Complexity of Care: Patient has no clinically significant social determinants affecting this chief complaint..   ED Course:    Medical Decision Making Patient presents with injury to left foot.  DDx includes, fracture, strain, or sprain.  Consultants: none  Plain films reveal fracture of the base of the 5th metatarsal.  Pt advised to follow up with PCP and/or orthopedics. Patient given boot and crutches while in ED, conservative therapy such as RICE recommended and discussed.   Patient will be discharged home & is agreeable with above plan. Returns precautions discussed. Pt appears safe for discharge.   Amount and/or Complexity of Data Reviewed Radiology: ordered.  Risk Prescription drug management.         Consultants: No consultations were needed in caring for this patient.   Treatment and Plan: Emergency department workup does not suggest an emergent condition requiring admission or immediate intervention beyond  what has been performed at this time. The patient is safe for discharge and has  been instructed to return immediately for worsening symptoms, change in  symptoms or any other concerns  Patient discussed with attending physician, Dr. Kermit Ped, who recommends boot, crutches, and ortho follow-up.  Final Clinical Impressions(s) / ED Diagnoses     ICD-10-CM   1. Closed nondisplaced fracture of fifth metatarsal bone of left foot,  initial encounter  S92.355A       ED Discharge Orders          Ordered    oxyCODONE-acetaminophen (PERCOCET) 5-325 MG tablet  Every 6 hours PRN        03/21/24 0130              Discharge Instructions Discussed with and Provided to Patient:   Discharge Instructions   None      Sherel Dikes, PA-C 03/21/24 0131    Iva Mariner, MD 03/21/24 [phone]

## 2024-04-07 ENCOUNTER — Encounter: Payer: Self-pay | Admitting: Family

## 2024-04-07 ENCOUNTER — Ambulatory Visit (INDEPENDENT_AMBULATORY_CARE_PROVIDER_SITE_OTHER): Payer: Medicaid Other | Admitting: Family

## 2024-04-07 VITALS — BP 113/74 | HR 65 | Temp 98.4°F | Resp 16 | Ht 64.0 in | Wt 154.0 lb

## 2024-04-07 DIAGNOSIS — Z758 Other problems related to medical facilities and other health care: Secondary | ICD-10-CM

## 2024-04-07 DIAGNOSIS — Z1211 Encounter for screening for malignant neoplasm of colon: Secondary | ICD-10-CM

## 2024-04-07 DIAGNOSIS — Z0000 Encounter for general adult medical examination without abnormal findings: Secondary | ICD-10-CM

## 2024-04-07 DIAGNOSIS — Z1329 Encounter for screening for other suspected endocrine disorder: Secondary | ICD-10-CM

## 2024-04-07 DIAGNOSIS — Z130 Encounter for screening for diseases of the blood and blood-forming organs and certain disorders involving the immune mechanism: Secondary | ICD-10-CM

## 2024-04-07 DIAGNOSIS — Z13220 Encounter for screening for lipoid disorders: Secondary | ICD-10-CM

## 2024-04-07 DIAGNOSIS — Z13228 Encounter for screening for other metabolic disorders: Secondary | ICD-10-CM

## 2024-04-07 DIAGNOSIS — Z1231 Encounter for screening mammogram for malignant neoplasm of breast: Secondary | ICD-10-CM

## 2024-04-07 DIAGNOSIS — Z131 Encounter for screening for diabetes mellitus: Secondary | ICD-10-CM

## 2024-04-07 DIAGNOSIS — Z603 Acculturation difficulty: Secondary | ICD-10-CM

## 2024-04-07 NOTE — Progress Notes (Signed)
 -  Patient is here to have annually  complete physical examination  -Care gap address -labs taken

## 2024-04-07 NOTE — Progress Notes (Signed)
 Patient ID: Shannon Riley Shannon Riley, female    DOB: 1970-12-31  MRN: [id number]  CC: Annual Exam  Subjective: Shannon Riley is a 53 y.o. female who presents for annual exam.   Her concerns today include:  - Established with specialist for left foot.  Patient Active Problem List   Diagnosis Date Noted   Prediabetes 02/05/2022     Current Outpatient Medications on File Prior to Visit  Medication Sig Dispense Refill   RESTASIS 0.05 % ophthalmic emulsion 1 drop 2 (two) times daily.     No current facility-administered medications on file prior to visit.    No Known Allergies  Social History   Socioeconomic History   Marital status: Married    Spouse name: Not on file   Number of children: Not on file   Years of education: Not on file   Highest education level: Not on file  Occupational History   Not on file  Tobacco Use   Smoking status: Never    Passive exposure: Never   Smokeless tobacco: Never  Vaping Use   Vaping status: Never Used  Substance and Sexual Activity   Alcohol use: Never   Drug use: Never   Sexual activity: Not Currently    Birth control/protection: None  Other Topics Concern   Not on file  Social History Narrative   Right Handed    Lives in a one story floor plan on the second floor in condominium    Social Drivers of Health   Financial Resource Strain: Medium Risk (04/07/2024)   Overall Financial Resource Strain (CARDIA)    Difficulty of Paying Living Expenses: Somewhat hard  Food Insecurity: No Food Insecurity (04/07/2024)   Hunger Vital Sign    Worried About Running Out of Food in the Last Year: Never true    Ran Out of Food in the Last Year: Never true  Transportation Needs: Unknown (04/07/2024)   PRAPARE - Administrator, Civil Service (Medical): No    Lack of Transportation (Non-Medical): Not on file  Physical Activity: Inactive (04/07/2024)   Exercise Vital Sign    Days of Exercise per Week: 0 days    Minutes of Exercise per  Session: 0 min  Stress: No Stress Concern Present (04/07/2024)   [employer] of Occupational Health - Occupational Stress Questionnaire    Feeling of Stress : Not at all  Social Connections: Socially Integrated (04/07/2024)   Social Connection and Isolation Panel [NHANES]    Frequency of Communication with Friends and Family: Twice a week    Frequency of Social Gatherings with Friends and Family: Twice a week    Attends Religious Services: 1 to 4 times per year    Active Member of [employer] or Organizations: Patient unable to answer    Attends Banker Meetings: 1 to 4 times per year    Marital Status: Married  Catering manager Violence: Not At Risk (04/07/2024)   Humiliation, Afraid, Rape, and Kick questionnaire    Fear of Current or Ex-Partner: No    Emotionally Abused: No    Physically Abused: No    Sexually Abused: No    Family History  Problem Relation Age of Onset   Cancer Maternal Aunt    Breast cancer Maternal Aunt     History reviewed. No pertinent surgical history.  ROS: Review of Systems Negative except as stated above  PHYSICAL EXAM: BP 113/74   Pulse 65   Temp 98.4 F (36.9 C) (Axillary)  Resp 16   Ht 5\' 4"  (1.626 m)   Wt 154 lb (69.9 kg)   SpO2 96%   BMI 26.43 kg/m   Physical Exam HENT:     Head: Normocephalic and atraumatic.     Right Ear: Tympanic membrane, ear canal and external ear normal.     Left Ear: Tympanic membrane, ear canal and external ear normal.     Nose: Nose normal.     Mouth/Throat:     Mouth: Mucous membranes are moist.     Pharynx: Oropharynx is clear.  Eyes:     Extraocular Movements: Extraocular movements intact.     Conjunctiva/sclera: Conjunctivae normal.     Pupils: Pupils are equal, round, and reactive to light.  Neck:     Thyroid: No thyroid mass, thyromegaly or thyroid tenderness.  Cardiovascular:     Rate and Rhythm: Normal rate and regular rhythm.     Pulses: Normal pulses.     Heart sounds: Normal  heart sounds.  Pulmonary:     Effort: Pulmonary effort is normal.     Breath sounds: Normal breath sounds.  Chest:     Comments: Patient declined. Abdominal:     General: Bowel sounds are normal.     Palpations: Abdomen is soft.  Genitourinary:    Comments: Patient declined. Musculoskeletal:        General: Normal range of motion.     Right shoulder: Normal.     Left shoulder: Normal.     Right upper arm: Normal.     Left upper arm: Normal.     Right elbow: Normal.     Left elbow: Normal.     Right forearm: Normal.     Left forearm: Normal.     Right wrist: Normal.     Left wrist: Normal.     Right hand: Normal.     Left hand: Normal.     Cervical back: Normal, normal range of motion and neck supple.     Thoracic back: Normal.     Lumbar back: Normal.     Right hip: Normal.     Left hip: Normal.     Right upper leg: Normal.     Left upper leg: Normal.     Right knee: Normal.     Left knee: Normal.     Right lower leg: Normal.     Left lower leg: Normal.     Right ankle: Normal.     Left ankle: Normal.     Right foot: Normal.     Left foot: Normal.     Comments: Left lower extremity orthopedic device in place.  Skin:    General: Skin is warm and dry.     Capillary Refill: Capillary refill takes less than 2 seconds.  Neurological:     General: No focal deficit present.     Mental Status: She is alert and oriented to person, place, and time.  Psychiatric:        Mood and Affect: Mood normal.        Behavior: Behavior normal.     ASSESSMENT AND PLAN: 1. Annual physical exam (Primary) - Counseled on 150 minutes of exercise per week as tolerated, healthy eating (including decreased daily intake of saturated fats, cholesterol, added sugars, sodium), STI prevention, and routine healthcare maintenance.  2. Screening for metabolic disorder - Routine screening.  - CMP14+EGFR  3. Screening for deficiency anemia - Routine screening.  - CBC  4. Diabetes mellitus  screening -  Routine screening.  - Hemoglobin A1c  5. Screening cholesterol level - Routine screening.  - Lipid panel  6. Thyroid disorder screen - Routine screening.  - TSH  7. Encounter for screening mammogram for malignant neoplasm of breast - Routine screening.  - MM Digital Screening; Future  8. Colon cancer screening - Referral to Gastroenterology for colon cancer screening by colonoscopy. - Ambulatory referral to Gastroenterology  9. Language barrier - [place] in-person interpreter, [place].    Patient was given the opportunity to ask questions.  Patient verbalized understanding of the plan and was able to repeat key elements of the plan. Patient was given clear instructions to go to Emergency Department or return to medical center if symptoms don't improve, worsen, or new problems develop.The patient verbalized understanding.   Orders Placed This Encounter  Procedures   MM Digital Screening   CBC   Lipid panel   CMP14+EGFR   Hemoglobin A1c   TSH   Ambulatory referral to Gastroenterology    Return in about 1 year (around 04/07/2025) for Physical per patient preference.  Senaida Dama, NP

## 2024-04-08 LAB — TSH: TSH: 1.28 u[IU]/mL (ref 0.450–4.500)

## 2024-04-08 LAB — LIPID PANEL
Chol/HDL Ratio: 3.5 ratio (ref 0.0–4.4)
Cholesterol, Total: 166 mg/dL (ref 100–199)
HDL: 47 mg/dL (ref >39)
LDL Chol Calc (NIH): 104 mg/dL — ABNORMAL HIGH (ref 0–99)
Triglycerides: 81 mg/dL (ref 0–149)
VLDL Cholesterol Cal: 15 mg/dL (ref 5–40)

## 2024-04-08 LAB — CMP14+EGFR
ALT: 13 IU/L (ref 0–32)
AST: 17 IU/L (ref 0–40)
Albumin: 4.3 g/dL (ref 3.8–4.9)
Alkaline Phosphatase: 111 IU/L (ref 44–121)
BUN/Creatinine Ratio: 26 — ABNORMAL HIGH (ref 9–23)
BUN: 16 mg/dL (ref 6–24)
Bilirubin Total: 0.3 mg/dL (ref 0.0–1.2)
CO2: 23 mmol/L (ref 20–29)
Calcium: 9.4 mg/dL (ref 8.7–10.2)
Chloride: 104 mmol/L (ref 96–106)
Creatinine, Ser: 0.61 mg/dL (ref 0.57–1.00)
Globulin, Total: 2.8 g/dL (ref 1.5–4.5)
Glucose: 95 mg/dL (ref 70–99)
Potassium: 4.5 mmol/L (ref 3.5–5.2)
Sodium: 141 mmol/L (ref 134–144)
Total Protein: 7.1 g/dL (ref 6.0–8.5)
eGFR: 107 mL/min/{1.73_m2} (ref >59)

## 2024-04-08 LAB — CBC
Hematocrit: 41 % (ref 34.0–46.6)
Hemoglobin: 13.4 g/dL (ref 11.1–15.9)
MCH: 28 pg (ref 26.6–33.0)
MCHC: 32.7 g/dL (ref 31.5–35.7)
MCV: 86 fL (ref 79–97)
Platelets: 291 10*3/uL (ref 150–450)
RBC: 4.78 x10E6/uL (ref 3.77–5.28)
RDW: 13 % (ref 11.7–15.4)
WBC: 6 10*3/uL (ref 3.4–10.8)

## 2024-04-08 LAB — HEMOGLOBIN A1C
Est. average glucose Bld gHb Est-mCnc: 123 mg/dL
Hgb A1c MFr Bld: 5.9 % — ABNORMAL HIGH (ref 4.8–5.6)

## 2024-04-10 ENCOUNTER — Encounter: Payer: Self-pay | Admitting: Family

## 2024-05-09 ENCOUNTER — Encounter: Payer: Self-pay | Admitting: Family

## 2024-06-13 ENCOUNTER — Ambulatory Visit
Admission: RE | Admit: 2024-06-13 | Discharge: 2024-06-13 | Disposition: A | Discharge status: Home / Self Care | Source: Ambulatory Visit | Attending: Family | Admitting: Family

## 2024-06-13 DIAGNOSIS — Z1231 Encounter for screening mammogram for malignant neoplasm of breast: Secondary | ICD-10-CM

## 2024-06-19 ENCOUNTER — Ambulatory Visit: Payer: Self-pay | Admitting: Family

## 2024-07-13 NOTE — Progress Notes (Deleted)
      Ellouise Console, PA-C [address] [place], [place]  [address] Phone: [phone]   Gastroenterology Consultation  Referring Provider:     Lorren Greig PARAS, NP Primary Care Physician:  Lorren Greig PARAS, NP Primary Gastroenterologist:  Ellouise Console, PA-C / *** Reason for Consultation:     Discuss colonoscopy        HPI:   Shannon Riley is a 53 y.o. y/o female referred for consultation & management  by Lorren Greig PARAS, NP.  Patient is here to schedule first screening colonoscopy due to age.  She has never had a colonoscopy.  No family history of colon cancer.  No regular medical problems.  Current symptoms:  04/07/2024 labs: Normal CBC (Hgb 13.4), normal CMP and TSH.  No past medical history on file.  No past surgical history on file.  Prior to Admission medications   Medication Sig Start Date End Date Taking? Authorizing Provider  RESTASIS 0.05 % ophthalmic emulsion 1 drop 2 (two) times daily. 01/17/24   [provider]    Family History  Problem Relation Age of Onset   Cancer Maternal Aunt    Breast cancer Maternal Aunt      Social History   Tobacco Use   Smoking status: Never    Passive exposure: Never   Smokeless tobacco: Never  Vaping Use   Vaping status: Never Used  Substance Use Topics   Alcohol use: Never   Drug use: Never    Allergies as of 07/14/2024   (No Known Allergies)    Review of Systems:    All systems reviewed and negative except where noted in HPI.   Physical Exam:  There were no vitals taken for this visit. No LMP recorded. Patient is perimenopausal.  General:   Alert,  Well-developed, well-nourished, pleasant and cooperative in NAD Lungs:  Respirations even and unlabored.  Clear throughout to auscultation.   No wheezes, crackles, or rhonchi. No acute distress. Heart:  Regular rate and rhythm; no murmurs, clicks, rubs, or gallops. Abdomen:  Normal bowel sounds.  No bruits.  Soft, and non-distended without masses,  hepatosplenomegaly or hernias noted.  No Tenderness.  No guarding or rebound tenderness.    Neurologic:  Alert and oriented x3;  grossly normal neurologically. Psych:  Alert and cooperative. Normal mood and affect.  Imaging Studies: No results found.  Labs: CBC    Component Value Date/Time   WBC 6.0 04/07/2024 1535   RBC 4.78 04/07/2024 1535   HGB 13.4 04/07/2024 1535   HCT 41.0 04/07/2024 1535   PLT 291 04/07/2024 1535   MCV 86 04/07/2024 1535    CMP     Component Value Date/Time   NA 141 04/07/2024 1535   K 4.5 04/07/2024 1535   CL 104 04/07/2024 1535   CO2 23 04/07/2024 1535   GLUCOSE 95 04/07/2024 1535   BUN 16 04/07/2024 1535   CREATININE 0.61 04/07/2024 1535   CALCIUM 9.4 04/07/2024 1535   PROT 7.1 04/07/2024 1535   ALBUMIN 4.3 04/07/2024 1535   AST 17 04/07/2024 1535   ALT 13 04/07/2024 1535   ALKPHOS 111 04/07/2024 1535   BILITOT 0.3 04/07/2024 1535    Assessment and Plan:   Shannon Riley is a 53 y.o. y/o female has been referred for ***  Follow up ***  Ellouise Console, PA-C

## 2024-07-14 ENCOUNTER — Ambulatory Visit: Admitting: Physician Assistant

## 2025-04-09 ENCOUNTER — Encounter: Admitting: Family
